# Patient Record
Sex: Female | Born: 1955
Health system: Southern US, Community
[De-identification: ages and names within clinical notes are randomized; demographics above are authoritative.]

## PROBLEM LIST (undated history)

## (undated) DIAGNOSIS — M8580 Other specified disorders of bone density and structure, unspecified site: Secondary | ICD-10-CM

## (undated) DIAGNOSIS — Z87442 Personal history of urinary calculi: Secondary | ICD-10-CM

## (undated) DIAGNOSIS — D649 Anemia, unspecified: Secondary | ICD-10-CM

## (undated) DIAGNOSIS — B86 Scabies: Secondary | ICD-10-CM

## (undated) DIAGNOSIS — N809 Endometriosis, unspecified: Secondary | ICD-10-CM

## (undated) DIAGNOSIS — D219 Benign neoplasm of connective and other soft tissue, unspecified: Secondary | ICD-10-CM

## (undated) HISTORY — DX: Other specified disorders of bone density and structure, unspecified site: M85.80

## (undated) HISTORY — PX: VARICOSE VEIN SURGERY: SHX832

## (undated) HISTORY — DX: Scabies: B86

## (undated) HISTORY — PX: EYE SURGERY: SHX253

## (undated) HISTORY — DX: Endometriosis, unspecified: N80.9

## (undated) HISTORY — DX: Benign neoplasm of connective and other soft tissue, unspecified: D21.9

---

## 1998-10-01 HISTORY — PX: OTHER SURGICAL HISTORY: SHX169

## 1998-10-07 ENCOUNTER — Ambulatory Visit (HOSPITAL_COMMUNITY): Admission: RE | Admit: 1998-10-07 | Discharge: 1998-10-07 | Payer: Self-pay | Admitting: Obstetrics and Gynecology

## 1999-05-01 ENCOUNTER — Other Ambulatory Visit: Admission: RE | Admit: 1999-05-01 | Discharge: 1999-05-01 | Payer: Self-pay | Admitting: Obstetrics and Gynecology

## 1999-07-03 HISTORY — PX: SUPRACERVICAL ABDOMINAL HYSTERECTOMY: SHX5393

## 1999-07-03 HISTORY — PX: ABDOMINAL HYSTERECTOMY: SHX81

## 2004-04-19 ENCOUNTER — Other Ambulatory Visit: Admission: RE | Admit: 2004-04-19 | Discharge: 2004-04-19 | Payer: Self-pay | Admitting: Obstetrics and Gynecology

## 2004-05-18 ENCOUNTER — Ambulatory Visit (HOSPITAL_COMMUNITY): Admission: RE | Admit: 2004-05-18 | Discharge: 2004-05-18 | Payer: Self-pay | Admitting: Obstetrics and Gynecology

## 2005-04-23 ENCOUNTER — Other Ambulatory Visit: Admission: RE | Admit: 2005-04-23 | Discharge: 2005-04-23 | Payer: Self-pay | Admitting: Obstetrics and Gynecology

## 2005-05-21 ENCOUNTER — Ambulatory Visit (HOSPITAL_COMMUNITY): Admission: RE | Admit: 2005-05-21 | Discharge: 2005-05-21 | Payer: Self-pay | Admitting: Obstetrics and Gynecology

## 2005-06-13 ENCOUNTER — Encounter: Admission: RE | Admit: 2005-06-13 | Discharge: 2005-06-13 | Payer: Self-pay | Admitting: Obstetrics and Gynecology

## 2006-04-26 ENCOUNTER — Other Ambulatory Visit: Admission: RE | Admit: 2006-04-26 | Discharge: 2006-04-26 | Payer: Self-pay | Admitting: Obstetrics & Gynecology

## 2006-05-27 ENCOUNTER — Ambulatory Visit (HOSPITAL_COMMUNITY): Admission: RE | Admit: 2006-05-27 | Discharge: 2006-05-27 | Payer: Self-pay | Admitting: Obstetrics & Gynecology

## 2007-05-07 ENCOUNTER — Other Ambulatory Visit: Admission: RE | Admit: 2007-05-07 | Discharge: 2007-05-07 | Payer: Self-pay | Admitting: *Deleted

## 2007-05-30 ENCOUNTER — Ambulatory Visit (HOSPITAL_COMMUNITY): Admission: RE | Admit: 2007-05-30 | Discharge: 2007-05-30 | Payer: Self-pay | Admitting: *Deleted

## 2008-05-11 ENCOUNTER — Other Ambulatory Visit: Admission: RE | Admit: 2008-05-11 | Discharge: 2008-05-11 | Payer: Self-pay | Admitting: Obstetrics and Gynecology

## 2008-06-03 ENCOUNTER — Ambulatory Visit (HOSPITAL_COMMUNITY): Admission: RE | Admit: 2008-06-03 | Discharge: 2008-06-03 | Payer: Self-pay | Admitting: Obstetrics & Gynecology

## 2009-06-06 ENCOUNTER — Ambulatory Visit (HOSPITAL_COMMUNITY): Admission: RE | Admit: 2009-06-06 | Discharge: 2009-06-06 | Payer: Self-pay | Admitting: Obstetrics & Gynecology

## 2010-06-07 ENCOUNTER — Ambulatory Visit (HOSPITAL_COMMUNITY)
Admission: RE | Admit: 2010-06-07 | Discharge: 2010-06-07 | Payer: Self-pay | Source: Home / Self Care | Attending: Obstetrics and Gynecology | Admitting: Obstetrics and Gynecology

## 2011-04-18 ENCOUNTER — Other Ambulatory Visit: Payer: Self-pay | Admitting: Obstetrics and Gynecology

## 2011-04-18 DIAGNOSIS — Z1231 Encounter for screening mammogram for malignant neoplasm of breast: Secondary | ICD-10-CM

## 2011-06-13 ENCOUNTER — Ambulatory Visit (HOSPITAL_COMMUNITY)
Admission: RE | Admit: 2011-06-13 | Discharge: 2011-06-13 | Disposition: A | Discharge status: Home / Self Care | Payer: 59 | Source: Ambulatory Visit | Attending: Obstetrics and Gynecology | Admitting: Obstetrics and Gynecology

## 2011-06-13 DIAGNOSIS — Z1231 Encounter for screening mammogram for malignant neoplasm of breast: Secondary | ICD-10-CM

## 2012-04-30 ENCOUNTER — Other Ambulatory Visit: Payer: Self-pay | Admitting: Certified Nurse Midwife

## 2012-04-30 DIAGNOSIS — Z1231 Encounter for screening mammogram for malignant neoplasm of breast: Secondary | ICD-10-CM

## 2012-06-16 ENCOUNTER — Ambulatory Visit (HOSPITAL_COMMUNITY)
Admission: RE | Admit: 2012-06-16 | Discharge: 2012-06-16 | Disposition: A | Discharge status: Home / Self Care | Payer: 59 | Source: Ambulatory Visit | Attending: Certified Nurse Midwife | Admitting: Certified Nurse Midwife

## 2012-06-16 DIAGNOSIS — Z1231 Encounter for screening mammogram for malignant neoplasm of breast: Secondary | ICD-10-CM

## 2013-06-03 ENCOUNTER — Encounter: Payer: Self-pay | Admitting: Certified Nurse Midwife

## 2013-06-04 ENCOUNTER — Encounter: Payer: Self-pay | Admitting: Certified Nurse Midwife

## 2013-06-04 ENCOUNTER — Ambulatory Visit (INDEPENDENT_AMBULATORY_CARE_PROVIDER_SITE_OTHER): Payer: 59 | Admitting: Certified Nurse Midwife

## 2013-06-04 VITALS — BP 104/62 | HR 64 | Resp 16 | Ht 67.5 in | Wt 132.0 lb

## 2013-06-04 DIAGNOSIS — Z01419 Encounter for gynecological examination (general) (routine) without abnormal findings: Secondary | ICD-10-CM

## 2013-06-04 DIAGNOSIS — Z0000 Encounter for general adult medical examination without abnormal findings: Secondary | ICD-10-CM

## 2013-06-04 LAB — POCT URINALYSIS DIPSTICK
Bilirubin, UA: NEGATIVE
Blood, UA: NEGATIVE
Glucose, UA: NEGATIVE
Ketones, UA: NEGATIVE
Leukocytes, UA: NEGATIVE
Nitrite, UA: NEGATIVE
Protein, UA: NEGATIVE
Urobilinogen, UA: NEGATIVE
pH, UA: 5

## 2013-06-04 NOTE — Patient Instructions (Signed)

## 2013-06-04 NOTE — Progress Notes (Signed)
57 y.o. G48P1001 Married Caucasian Fe here for annual exam. Menopausal no HRT. Denies vaginal bleeding or vaginal dryness. Stressful year with elderly parents care in [place], and making preparation. Patient does have family support and friends support. Established with PCP this year with labs and aex, with shingle vaccine given.  Patient's last menstrual period was 12/31/1999.          Sexually active: no  The current method of family planning is status post hysterectomy.    Exercising: yes  some exercise Smoker:  no  Health Maintenance: Pap:  05-26-12 neg HPV HR neg MMG:  06-17-12 neg Colonoscopy: 2008 BMD:   2009 TDaP:  2008 Labs: Poct urine-neg Self breast exam: not done   reports that she has never smoked. She does not have any smokeless tobacco history on file. She reports that she drinks about 1.5 ounces of alcohol per week. She reports that she does not use illicit drugs.  Past Medical History  Diagnosis Date  . Fibroid   . Endometriosis     Past Surgical History  Procedure Laterality Date  . Abdominal hysterectomy  2001    LAVH retained ovaries  . Hysteroscopic resection  4/00  . Varicose vein surgery  2011,2012,2013    Current Outpatient Prescriptions  Medication Sig Dispense Refill  . bimatoprost (LUMIGAN) 0.03 % ophthalmic solution 1 drop at bedtime.      . Calcium-Vitamin D-Vitamin K (VIACTIV PO) Take by mouth as needed.       No current facility-administered medications for this visit.    Family History  Problem Relation Age of Onset  . Heart disease Father   . Stroke Father   . Cancer Father     prostate  . Hypertension Mother   . Cancer Maternal Grandfather     liver    ROS:  Pertinent items are noted in HPI.  Otherwise, a comprehensive ROS was negative.  Exam:   BP 104/62  Pulse 64  Resp 16  Ht 5' 7.5" (1.715 m)  Wt 132 lb (59.875 kg)  BMI 20.36 kg/m2  LMP 12/31/1999 Height: 5' 7.5" (171.5 cm)  Ht Readings from Last 3 Encounters:   06/04/13 5' 7.5" (1.715 m)    General appearance: alert, cooperative and appears stated age Head: Normocephalic, without obvious abnormality, atraumatic Neck: no adenopathy, supple, symmetrical, trachea midline and thyroid normal to inspection and palpation and non-palpable Lungs: clear to auscultation bilaterally Breasts: normal appearance, no masses or tenderness, No nipple retraction or dimpling, No nipple discharge or bleeding, No axillary or supraclavicular adenopathy Heart: regular rate and rhythm Abdomen: soft, non-tender; no masses,  no organomegaly Extremities: extremities normal, atraumatic, no cyanosis or edema Skin: Skin color, texture, turgor normal. No rashes or lesions Lymph nodes: Cervical, supraclavicular, and axillary nodes normal. No abnormal inguinal nodes palpated Neurologic: Grossly normal   Pelvic: External genitalia:  no lesions              Urethra:  normal appearing urethra with no masses, tenderness or lesions              Bartholin's and Skene's: normal                 Vagina: normal appearing vagina with normal color and discharge, no lesions              Cervix: normal non tender              Pap taken: no Bimanual Exam:  Uterus:  uterus  absent              Adnexa: normal adnexa and no mass, fullness, tenderness               Rectovaginal: Confirms               Anus:  normal sphincter tone, no lesions  A:  Well Woman with normal exam  Menopausal no HRT LAVH suprcervical ovaries retained fibroids  Social stress with family care  P:   Reviewed health and wellness pertinent to exam  Aware of need to evaluate if vaginal bleeding  Pap smear as per guidelines   Mammogram yearly pap smear not taken today  counseled on breast self exam, mammography screening, menopause, osteoporosis, diet and exercise, Kegel's exercises  return annually or prn  An After Visit Summary was printed and given to the patient.

## 2013-06-06 NOTE — Progress Notes (Signed)
Note reviewed, agree with plan.  Salathiel Ferrara, MD  

## 2013-06-08 ENCOUNTER — Telehealth: Payer: Self-pay | Admitting: Certified Nurse Midwife

## 2013-06-08 DIAGNOSIS — Z13820 Encounter for screening for osteoporosis: Secondary | ICD-10-CM

## 2013-06-08 NOTE — Telephone Encounter (Signed)
[facility]

## 2013-06-09 ENCOUNTER — Other Ambulatory Visit: Payer: Self-pay | Admitting: Certified Nurse Midwife

## 2013-06-09 DIAGNOSIS — Z1231 Encounter for screening mammogram for malignant neoplasm of breast: Secondary | ICD-10-CM

## 2013-06-09 NOTE — Telephone Encounter (Signed)
Debbi, last Dexa 2009. Okay to order?

## 2013-06-09 NOTE — Telephone Encounter (Signed)
Notified patient.  She will call to schedule.

## 2013-06-09 NOTE — Telephone Encounter (Signed)
Yes ok for BMD

## 2013-07-07 ENCOUNTER — Ambulatory Visit (HOSPITAL_COMMUNITY)
Admission: RE | Admit: 2013-07-07 | Discharge: 2013-07-07 | Disposition: A | Discharge status: Home / Self Care | Payer: 59 | Source: Ambulatory Visit | Attending: Certified Nurse Midwife | Admitting: Certified Nurse Midwife

## 2013-07-07 DIAGNOSIS — Z1231 Encounter for screening mammogram for malignant neoplasm of breast: Secondary | ICD-10-CM

## 2013-07-07 DIAGNOSIS — Z780 Asymptomatic menopausal state: Secondary | ICD-10-CM | POA: Insufficient documentation

## 2013-07-07 DIAGNOSIS — M8580 Other specified disorders of bone density and structure, unspecified site: Secondary | ICD-10-CM

## 2013-07-07 DIAGNOSIS — Z13820 Encounter for screening for osteoporosis: Secondary | ICD-10-CM

## 2013-07-07 HISTORY — DX: Other specified disorders of bone density and structure, unspecified site: M85.80

## 2013-07-13 ENCOUNTER — Telehealth: Payer: Self-pay

## 2013-07-13 ENCOUNTER — Encounter: Payer: Self-pay | Admitting: Certified Nurse Midwife

## 2013-07-13 NOTE — Telephone Encounter (Signed)
Called to give pt bmd results. See actual scan & results in epic. LMTCB

## 2013-07-14 NOTE — Telephone Encounter (Signed)
Patient is returning call to joy °

## 2013-07-14 NOTE — Telephone Encounter (Signed)
Pt notified of results & agrees with recommendations

## 2014-05-03 ENCOUNTER — Encounter: Payer: Self-pay | Admitting: Certified Nurse Midwife

## 2014-06-07 ENCOUNTER — Encounter: Payer: Self-pay | Admitting: Certified Nurse Midwife

## 2014-06-07 ENCOUNTER — Other Ambulatory Visit: Payer: Self-pay | Admitting: Certified Nurse Midwife

## 2014-06-07 ENCOUNTER — Ambulatory Visit (INDEPENDENT_AMBULATORY_CARE_PROVIDER_SITE_OTHER): Payer: 59 | Admitting: Certified Nurse Midwife

## 2014-06-07 VITALS — BP 92/60 | HR 64 | Resp 16 | Ht 67.25 in | Wt 136.0 lb

## 2014-06-07 DIAGNOSIS — Z1231 Encounter for screening mammogram for malignant neoplasm of breast: Secondary | ICD-10-CM

## 2014-06-07 DIAGNOSIS — Z0000 Encounter for general adult medical examination without abnormal findings: Secondary | ICD-10-CM

## 2014-06-07 DIAGNOSIS — Z124 Encounter for screening for malignant neoplasm of cervix: Secondary | ICD-10-CM

## 2014-06-07 DIAGNOSIS — Z01419 Encounter for gynecological examination (general) (routine) without abnormal findings: Secondary | ICD-10-CM

## 2014-06-07 LAB — POCT URINALYSIS DIPSTICK
Bilirubin, UA: NEGATIVE
Blood, UA: NEGATIVE
Glucose, UA: NEGATIVE
Ketones, UA: NEGATIVE
Leukocytes, UA: NEGATIVE
Nitrite, UA: NEGATIVE
Protein, UA: NEGATIVE
Urobilinogen, UA: NEGATIVE
pH, UA: 5

## 2014-06-07 NOTE — Progress Notes (Signed)
58 y.o. G4P1001 Married Caucasian Fe here for annual exam. Menopausal no HRT. Denies vaginal bleeding or vaginal discharge. Sees PCP yearly with labs. Social stress with mother in law who has fallen with multiple injuries and dementia. Father in law fell last year and is doing well in assisted living. Patient is leaving for [place] to assess situation. Does have family support. No other health issues today.  Patient's last menstrual period was 12/31/1999.          Sexually active: No.  The current method of family planning is status post hysterectomy.    Exercising: Yes.    eliptical,hiking & yoga Smoker:  no  Health Maintenance: Pap: 05-26-12 neg HPV HR neg MMG: 07-07-13 density category c, birads category 1:neg Colonoscopy:  2008 BMD:  07-07-13 TDaP: 2008 Labs: Poct urine-neg Self breast exam: not done   reports that she has never smoked. She does not have any smokeless tobacco history on file. She reports that she drinks about 1.8 oz of alcohol per week. She reports that she does not use illicit drugs.  Past Medical History  Diagnosis Date  . Fibroid   . Endometriosis   . Osteopenia 07-07-13    spine only -1.2    Past Surgical History  Procedure Laterality Date  . Abdominal hysterectomy  2001    LAVH retained ovaries  . Hysteroscopic resection  4/00  . Varicose vein surgery  2011,2012,2013    Current Outpatient Prescriptions  Medication Sig Dispense Refill  . Ascorbic Acid (VITAMIN C PO) Take by mouth daily.    . bimatoprost (LUMIGAN) 0.03 % ophthalmic solution 1 drop at bedtime.    . Calcium-Vitamin D-Vitamin K (VIACTIV PO) Take by mouth as needed.    . Cholecalciferol (VITAMIN D PO) Take by mouth daily.     No current facility-administered medications for this visit.    Family History  Problem Relation Age of Onset  . Heart disease Father   . Stroke Father   . Cancer Father     prostate  . Hypertension Mother   . Cancer Maternal Grandfather     liver    ROS:   Pertinent items are noted in HPI.  Otherwise, a comprehensive ROS was negative.  Exam:   BP 92/60 mmHg  Pulse 64  Resp 16  Ht 5' 7.25" (1.708 m)  Wt 136 lb (61.689 kg)  BMI 21.15 kg/m2  LMP 12/31/1999 Height: 5' 7.25" (170.8 cm)  Ht Readings from Last 3 Encounters:  06/07/14 5' 7.25" (1.708 m)  06/04/13 5' 7.5" (1.715 m)    General appearance: alert, cooperative and appears stated age Head: Normocephalic, without obvious abnormality, atraumatic Neck: no adenopathy, supple, symmetrical, trachea midline and thyroid normal to inspection and palpation and non-palpable Lungs: clear to auscultation bilaterally Breasts: normal appearance, no masses or tenderness, Inspection negative, No nipple retraction or dimpling, No nipple discharge or bleeding Heart: regular rate and rhythm Abdomen: soft, non-tender; no masses,  no organomegaly Extremities: extremities normal, atraumatic, no cyanosis or edema Skin: Skin color, texture, turgor normal. No rashes or lesions Lymph nodes: Cervical, supraclavicular, and axillary nodes normal. No abnormal inguinal nodes palpated Neurologic: Grossly normal   Pelvic: External genitalia:  no lesions              Urethra:  normal appearing urethra with no masses, tenderness or lesions              Bartholin's and Skene's: normal  Vagina: normal appearing vagina with normal color and discharge, no lesions              Cervix: normal, non tender              Pap taken: Yes.   Bimanual Exam:  Uterus:  uterus absent              Adnexa: normal adnexa and no mass, fullness, tenderness               Rectovaginal: Confirms               Anus:  normal sphincter tone, no lesions  A:  Well Woman with normal exam  Menopausal no HRT S/P LAVH supracervical with ovaries retained for endometrosis  Social stress with family care giving, but has family support    P:   Reviewed health and wellness pertinent to exam  Aware of need to advise if vaginal  bleeding.  Stressed taking time for self and seek support as needed.  Pap smear taken today with HPV reflex   counseled on breast self exam, mammography screening, adequate intake of calcium and vitamin D, diet and exercise  return annually or prn  An After Visit Summary was printed and given to the patient.

## 2014-06-07 NOTE — Patient Instructions (Signed)

## 2014-06-08 LAB — IPS PAP TEST WITH REFLEX TO HPV

## 2014-06-09 NOTE — Progress Notes (Signed)
Reviewed personally.  M. Suzanne Ricardo Schubach, MD.  

## 2014-07-12 ENCOUNTER — Ambulatory Visit (HOSPITAL_COMMUNITY)
Admission: RE | Admit: 2014-07-12 | Discharge: 2014-07-12 | Disposition: A | Discharge status: Home / Self Care | Payer: 59 | Source: Ambulatory Visit | Attending: Certified Nurse Midwife | Admitting: Certified Nurse Midwife

## 2014-07-12 DIAGNOSIS — Z1231 Encounter for screening mammogram for malignant neoplasm of breast: Secondary | ICD-10-CM | POA: Insufficient documentation

## 2015-06-15 ENCOUNTER — Encounter: Payer: Self-pay | Admitting: Certified Nurse Midwife

## 2015-06-15 ENCOUNTER — Ambulatory Visit (INDEPENDENT_AMBULATORY_CARE_PROVIDER_SITE_OTHER): Payer: 59 | Admitting: Certified Nurse Midwife

## 2015-06-15 VITALS — BP 110/72 | HR 72 | Resp 18 | Wt 140.0 lb

## 2015-06-15 DIAGNOSIS — N8110 Cystocele, unspecified: Secondary | ICD-10-CM

## 2015-06-15 DIAGNOSIS — Z01419 Encounter for gynecological examination (general) (routine) without abnormal findings: Secondary | ICD-10-CM | POA: Diagnosis not present

## 2015-06-15 DIAGNOSIS — IMO0002 Reserved for concepts with insufficient information to code with codable children: Secondary | ICD-10-CM

## 2015-06-15 NOTE — Progress Notes (Signed)
59 y.o. G39P1001 Married  Caucasian Fe here for annual exam. Menopausal no HRT. Denies vaginal bleeding or vaginal dryness. Has reestablished with PCP to keep current. Had labs with PCP all normal per patient. Took shingles vaccine. No health issues today.  Patient's last menstrual period was 12/31/1999.          Sexually active: No.  The current method of family planning is none.    Exercising: Yes.    Elliptical, yoga, hiking Smoker:  no  Health Maintenance: Pap:  06/2014 Negative MMG:  07/13/2014 BIRADS 1 Colonoscopy:  2007 Normal as per patient BMD:  07/07/2014 nromal TDaP:  2008 Shingles: 2015 as per patient Pneumonia: Never Hep C and HIV: declined will do at PCP Labs: none   reports that she has never smoked. She has never used smokeless tobacco. She reports that she drinks about 1.8 oz of alcohol per week. She reports that she does not use illicit drugs.  Past Medical History  Diagnosis Date  . Fibroid   . Endometriosis   . Osteopenia 07-07-13    spine only -1.2    Past Surgical History  Procedure Laterality Date  . Abdominal hysterectomy  2001    LAVH retained ovaries  . Hysteroscopic resection  4/00  . Varicose vein surgery  2011,2012,2013    Current Outpatient Prescriptions  Medication Sig Dispense Refill  . Ascorbic Acid (VITAMIN C PO) Take by mouth daily.    . bimatoprost (LUMIGAN) 0.03 % ophthalmic solution 1 drop at bedtime.    . Calcium-Vitamin D-Vitamin K (VIACTIV PO) Take by mouth as needed.    . Cholecalciferol (VITAMIN D PO) Take by mouth daily.     No current facility-administered medications for this visit.    Family History  Problem Relation Age of Onset  . Heart disease Father   . Stroke Father   . Cancer Father     prostate  . Hypertension Mother   . Cancer Maternal Grandfather     liver    ROS:  Pertinent items are noted in HPI.  Otherwise, a comprehensive ROS was negative.  Exam:   LMP 12/31/1999   Ht Readings from Last 3 Encounters:   06/07/14 5' 7.25" (1.708 m)  06/04/13 5' 7.5" (1.715 m)    General appearance: alert, cooperative and appears stated age Head: Normocephalic, without obvious abnormality, atraumatic Neck: no adenopathy, supple, symmetrical, trachea midline and thyroid normal to inspection and palpation Lungs: clear to auscultation bilaterally Breasts: normal appearance, no masses or tenderness, No nipple retraction or dimpling, No nipple discharge or bleeding, No axillary or supraclavicular adenopathy Heart: regular rate and rhythm Abdomen: soft, non-tender; no masses,  no organomegaly Extremities: extremities normal, atraumatic, no cyanosis or edema Skin: Skin color, texture, turgor normal. No rashes or lesions Lymph nodes: Cervical, supraclavicular, and axillary nodes normal. No abnormal inguinal nodes palpated Neurologic: Grossly normal   Pelvic: External genitalia:  no lesions              Urethra:  normal appearing urethra with no masses, tenderness or lesions              Bartholin's and Skene's: normal                 Vagina: normal appearing vagina with normal color and discharge, no lesions grade 2 cystocele present, no change              Cervix: absent  Pap taken: No. Bimanual Exam:  Uterus:  uterus absent              Adnexa: normal adnexa and no mass, fullness, tenderness               Rectovaginal: Confirms               Anus:  normal sphincter tone, no lesions  Chaperone present: yes  A:  Well Woman with normal exam  Menopausal no HRT S/P TAH for bleeding ovaries retained  Cystocele grade 2 with no change,not symptomatic    P:   Reviewed health and wellness pertinent to exam  Patient will advise if bladder changes or symptoms or pressure or incontinence.  Pap smear as above not taken   counseled on breast self exam, mammography screening, adequate intake of calcium and vitamin D, diet and exercise, Kegel's exercises  return annually or prn  An After Visit  Summary was printed and given to the patient.

## 2015-06-15 NOTE — Patient Instructions (Signed)

## 2015-06-18 NOTE — Progress Notes (Signed)
Reviewed personally.  M. Suzanne Saher Davee, MD.  

## 2015-06-21 ENCOUNTER — Other Ambulatory Visit: Payer: Self-pay

## 2015-06-21 DIAGNOSIS — Z1231 Encounter for screening mammogram for malignant neoplasm of breast: Secondary | ICD-10-CM

## 2015-07-21 ENCOUNTER — Ambulatory Visit: Payer: 59

## 2015-08-01 ENCOUNTER — Ambulatory Visit
Admission: RE | Admit: 2015-08-01 | Discharge: 2015-08-01 | Disposition: A | Discharge status: Home / Self Care | Payer: 59 | Source: Ambulatory Visit

## 2015-08-01 DIAGNOSIS — Z1231 Encounter for screening mammogram for malignant neoplasm of breast: Secondary | ICD-10-CM

## 2016-06-15 ENCOUNTER — Ambulatory Visit: Payer: 59 | Admitting: Certified Nurse Midwife

## 2016-06-19 ENCOUNTER — Ambulatory Visit (INDEPENDENT_AMBULATORY_CARE_PROVIDER_SITE_OTHER): Payer: 59 | Admitting: Certified Nurse Midwife

## 2016-06-19 ENCOUNTER — Encounter: Payer: Self-pay | Admitting: Certified Nurse Midwife

## 2016-06-19 VITALS — BP 98/60 | HR 64 | Resp 16 | Ht 67.0 in | Wt 141.0 lb

## 2016-06-19 DIAGNOSIS — Z01419 Encounter for gynecological examination (general) (routine) without abnormal findings: Secondary | ICD-10-CM

## 2016-06-19 DIAGNOSIS — Z0000 Encounter for general adult medical examination without abnormal findings: Secondary | ICD-10-CM

## 2016-06-19 DIAGNOSIS — Z23 Encounter for immunization: Secondary | ICD-10-CM | POA: Diagnosis not present

## 2016-06-19 DIAGNOSIS — Z124 Encounter for screening for malignant neoplasm of cervix: Secondary | ICD-10-CM | POA: Diagnosis not present

## 2016-06-19 LAB — CBC
HCT: 41.9 % (ref 35.0–45.0)
HEMOGLOBIN: 13.8 g/dL (ref 11.7–15.5)
MCH: 31.8 pg (ref 27.0–33.0)
MCHC: 32.9 g/dL (ref 32.0–36.0)
MCV: 96.5 fL (ref 80.0–100.0)
MPV: 9.6 fL (ref 7.5–12.5)
PLATELETS: 273 10*3/uL (ref 140–400)
RBC: 4.34 MIL/uL (ref 3.80–5.10)
RDW: 13.3 % (ref 11.0–15.0)
WBC: 7.8 10*3/uL (ref 3.8–10.8)

## 2016-06-19 LAB — POCT URINALYSIS DIPSTICK
Bilirubin, UA: NEGATIVE
Blood, UA: NEGATIVE
Glucose, UA: NEGATIVE
Ketones, UA: NEGATIVE
Leukocytes, UA: NEGATIVE
Nitrite, UA: NEGATIVE
Protein, UA: NEGATIVE
Urobilinogen, UA: NEGATIVE
pH, UA: 5

## 2016-06-19 LAB — TSH: TSH: 1.29 m[IU]/L

## 2016-06-19 NOTE — Progress Notes (Signed)
60 y.o. G1P1001 + 1 adopted  Married  Caucasian Fe here for annual exam. Menopausal no HRT. Denies vaginal bleeding or vaginal dryness. Sees PCP prn. Desires screening labs and Hep C screening. Spouse has retired and now traveling a lot. No health issues today.  Patient's last menstrual period was 12/31/1999.          Sexually active: No.  The current method of family planning is status post hysterectomy.    Exercising: Yes.    [employer], yoga, treadmill, weights Smoker:  no  Health Maintenance: Pap:  12/15 neg MMG:  08-01-15 category c density birads 1:neg Colonoscopy:  2007 neg per patient BMD:   2015 TDaP:  2008 Shingles: 2013 Pneumonia: not done Hep C and HIV:HIV done Labs: poct urine-neg Self breast exam: not done   reports that she has never smoked. She has never used smokeless tobacco. She reports that she drinks about 1.8 oz of alcohol per week . She reports that she does not use drugs.  Past Medical History:  Diagnosis Date  . Endometriosis   . Fibroid   . Osteopenia 07-07-13   spine only -1.2    Past Surgical History:  Procedure Laterality Date  . ABDOMINAL HYSTERECTOMY  2001   LAVH retained ovaries  . hysteroscopic resection  4/00  . VARICOSE VEIN SURGERY  2011,2012,2013    Current Outpatient Prescriptions  Medication Sig Dispense Refill  . Ascorbic Acid (VITAMIN C PO) Take by mouth daily.    . Calcium-Vitamin D-Vitamin K (VIACTIV PO) Take by mouth as needed.    . Cholecalciferol (VITAMIN D PO) Take by mouth daily.    Marland Kitchen LASTACAFT 0.25 % SOLN      No current facility-administered medications for this visit.     Family History  Problem Relation Age of Onset  . Heart disease Father   . Stroke Father   . Cancer Father     prostate  . Hypertension Mother   . Heart attack Mother     pacemaker  . Cancer Maternal Grandfather     liver    ROS:  Pertinent items are noted in HPI.  Otherwise, a comprehensive ROS was negative.  Exam:   BP 98/60   Pulse 64    Resp 16   Ht 5\' 7"  (1.702 m)   Wt 141 lb (64 kg)   LMP 12/31/1999   BMI 22.08 kg/m  Height: 5\' 7"  (170.2 cm) Ht Readings from Last 3 Encounters:  06/19/16 5\' 7"  (1.702 m)  06/07/14 5' 7.25" (1.708 m)  06/04/13 5' 7.5" (1.715 m)    General appearance: alert, cooperative and appears stated age Head: Normocephalic, without obvious abnormality, atraumatic Neck: no adenopathy, supple, symmetrical, trachea midline and thyroid normal to inspection and palpation Lungs: clear to auscultation bilaterally Breasts: normal appearance, no masses or tenderness, No nipple retraction or dimpling, No nipple discharge or bleeding, No axillary or supraclavicular adenopathy Heart: regular rate and rhythm Abdomen: soft, non-tender; no masses,  no organomegaly Extremities: extremities normal, atraumatic, no cyanosis or edema Skin: Skin color, texture, turgor normal. No rashes or lesions Lymph nodes: Cervical, supraclavicular, and axillary nodes normal. No abnormal inguinal nodes palpated Neurologic: Grossly normal   Pelvic: External genitalia:  no lesions              Urethra:  normal appearing urethra with no masses, tenderness or lesions              Bartholin's and Skene's: normal  Vagina: normal appearing vagina with normal color and discharge, no lesions              Cervix: no bleeding following Pap, no cervical motion tenderness and no lesions              Pap taken: Yes.   Bimanual Exam:  Uterus:  normal size, contour, position, consistency, mobility, non-tender              Adnexa: normal adnexa and no mass, fullness, tenderness               Rectovaginal: Confirms               Anus:  normal sphincter tone, no lesions  Chaperone present: yes  A:  Well Woman with normal exam  Menopausal no HRT  Immunization update  Screening labs  P:   Reviewed health and wellness pertinent to exam  Aware of need to evaluate if vaginal bleeding  Requests TDAP  Labs: TSH, Vit. D, Lipid  Panel Hep C, CBC  Pap smear as above with HPV reflex   counseled on breast self exam, mammography screening, osteoporosis, adequate intake of calcium and vitamin D, diet and exercise  return annually or prn  An After Visit Summary was printed and given to the patient.

## 2016-06-19 NOTE — Patient Instructions (Signed)

## 2016-06-20 LAB — IPS PAP TEST WITH REFLEX TO HPV

## 2016-06-20 LAB — LIPID PANEL
Cholesterol: 193 mg/dL (ref <200)
HDL: 58 mg/dL (ref >50)
LDL Cholesterol: 117 mg/dL — ABNORMAL HIGH (ref <100)
Total CHOL/HDL Ratio: 3.3 Ratio (ref <5.0)
Triglycerides: 89 mg/dL (ref <150)
VLDL: 18 mg/dL (ref <30)

## 2016-06-20 LAB — VITAMIN D 25 HYDROXY (VIT D DEFICIENCY, FRACTURES): VIT D 25 HYDROXY: 57 ng/mL (ref 30–100)

## 2016-06-20 LAB — HEPATITIS C ANTIBODY: HCV AB: NEGATIVE

## 2016-06-22 NOTE — Progress Notes (Signed)
Encounter reviewed Jill Jertson, MD   

## 2016-06-28 ENCOUNTER — Telehealth: Payer: Self-pay | Admitting: Certified Nurse Midwife

## 2016-06-28 ENCOUNTER — Other Ambulatory Visit: Payer: Self-pay | Admitting: Certified Nurse Midwife

## 2016-06-28 DIAGNOSIS — Z1231 Encounter for screening mammogram for malignant neoplasm of breast: Secondary | ICD-10-CM

## 2016-06-28 DIAGNOSIS — M8588 Other specified disorders of bone density and structure, other site: Secondary | ICD-10-CM

## 2016-06-28 NOTE — Telephone Encounter (Signed)
Patient requesting order for mammogram and bone density be sent to [facility]

## 2016-06-28 NOTE — Telephone Encounter (Signed)
Order for BMD placed. Order for screening mammogram was previously entered. Left detailed message at number provided [phone], okay per ROI. Advised order has been placed and she may [facility] at her earliest convenience to schedule. Advised to return call to the office with any further questions or concerns.  Routing to provider for final review. Patient agreeable to disposition. Will close encounter.

## 2016-08-02 ENCOUNTER — Ambulatory Visit
Admission: RE | Admit: 2016-08-02 | Discharge: 2016-08-02 | Disposition: A | Discharge status: Home / Self Care | Payer: 59 | Source: Ambulatory Visit | Attending: Certified Nurse Midwife | Admitting: Certified Nurse Midwife

## 2016-08-02 ENCOUNTER — Ambulatory Visit: Payer: 59

## 2016-08-02 DIAGNOSIS — Z1231 Encounter for screening mammogram for malignant neoplasm of breast: Secondary | ICD-10-CM

## 2016-08-02 DIAGNOSIS — M8588 Other specified disorders of bone density and structure, other site: Secondary | ICD-10-CM

## 2017-01-10 DIAGNOSIS — H6983 Other specified disorders of Eustachian tube, bilateral: Secondary | ICD-10-CM | POA: Diagnosis not present

## 2017-01-10 DIAGNOSIS — H6993 Unspecified Eustachian tube disorder, bilateral: Secondary | ICD-10-CM | POA: Insufficient documentation

## 2017-01-10 DIAGNOSIS — J343 Hypertrophy of nasal turbinates: Secondary | ICD-10-CM | POA: Diagnosis not present

## 2017-01-10 DIAGNOSIS — Z7289 Other problems related to lifestyle: Secondary | ICD-10-CM | POA: Diagnosis not present

## 2017-01-28 DIAGNOSIS — Z23 Encounter for immunization: Secondary | ICD-10-CM | POA: Diagnosis not present

## 2017-04-02 DIAGNOSIS — H401131 Primary open-angle glaucoma, bilateral, mild stage: Secondary | ICD-10-CM | POA: Diagnosis not present

## 2017-04-02 DIAGNOSIS — H5213 Myopia, bilateral: Secondary | ICD-10-CM | POA: Diagnosis not present

## 2017-04-02 DIAGNOSIS — H5000 Unspecified esotropia: Secondary | ICD-10-CM | POA: Diagnosis not present

## 2017-05-13 DIAGNOSIS — H401131 Primary open-angle glaucoma, bilateral, mild stage: Secondary | ICD-10-CM | POA: Diagnosis not present

## 2017-05-13 NOTE — Progress Notes (Signed)
Ms. Daris has received her flu shot today at the [facility] to her LT deltoid Lot # [id number] NDC: [phone] Mfg: [id number] Expires: 12/29/17

## 2017-06-05 DIAGNOSIS — H401122 Primary open-angle glaucoma, left eye, moderate stage: Secondary | ICD-10-CM | POA: Diagnosis not present

## 2017-06-28 ENCOUNTER — Ambulatory Visit: Payer: 59 | Admitting: Certified Nurse Midwife

## 2017-07-08 ENCOUNTER — Other Ambulatory Visit: Payer: Self-pay | Admitting: Certified Nurse Midwife

## 2017-07-08 DIAGNOSIS — Z1231 Encounter for screening mammogram for malignant neoplasm of breast: Secondary | ICD-10-CM

## 2017-07-09 ENCOUNTER — Ambulatory Visit: Payer: 59 | Admitting: Certified Nurse Midwife

## 2017-07-15 DIAGNOSIS — R21 Rash and other nonspecific skin eruption: Secondary | ICD-10-CM | POA: Diagnosis not present

## 2017-08-05 ENCOUNTER — Ambulatory Visit: Payer: 59

## 2017-08-07 DIAGNOSIS — R21 Rash and other nonspecific skin eruption: Secondary | ICD-10-CM | POA: Diagnosis not present

## 2017-08-08 ENCOUNTER — Ambulatory Visit: Payer: 59

## 2017-08-15 ENCOUNTER — Ambulatory Visit (INDEPENDENT_AMBULATORY_CARE_PROVIDER_SITE_OTHER): Payer: BLUE CROSS/BLUE SHIELD | Admitting: Family Medicine

## 2017-08-15 ENCOUNTER — Encounter: Payer: Self-pay | Admitting: Family Medicine

## 2017-08-15 VITALS — BP 100/68 | HR 76 | Temp 98.1°F | Ht 66.0 in | Wt 137.0 lb

## 2017-08-15 DIAGNOSIS — B86 Scabies: Secondary | ICD-10-CM

## 2017-08-15 DIAGNOSIS — Z7689 Persons encountering health services in other specified circumstances: Secondary | ICD-10-CM | POA: Diagnosis not present

## 2017-08-15 NOTE — Patient Instructions (Addendum)
Scabies, Adult Scabies is a skin condition that happens when very small insects get under the skin (infestation). This causes a rash and severe itchiness. Scabies can spread from person to person (is contagious). If you get scabies, it is common for others in your household to get scabies too. With proper treatment, symptoms usually go away in 2-4 weeks. Scabies usually does not cause lasting problems. What are the causes? This condition is caused by mites (Sarcoptes scabiei, or human itch mites) that can only be seen with a microscope. The mites get into the top layer of skin and lay eggs. Scabies can spread from person to person through:  Close contact with a person who has scabies.  Contact with infested items, such as towels, bedding, or clothing.  What increases the risk? This condition is more likely to develop in:  People who live in nursing homes and other extended-care facilities.  People who have sexual contact with a partner who has scabies.  Young children who attend child care facilities.  People who care for others who are at increased risk for scabies.  What are the signs or symptoms? Symptoms of this condition may include:  Severe itchiness. This is often worse at night.  A rash that includes tiny red bumps or blisters. The rash commonly occurs on the wrist, elbow, armpit, fingers, waist, groin, or buttocks. Bumps may form a line (burrow) in some areas.  Skin irritation. This can include scaly patches or sores.  How is this diagnosed? This condition is diagnosed with a physical exam. Your health care provider will look closely at your skin. In some cases, your health care provider may take a sample of your affected skin (skin scraping) and have it examined under a microscope. How is this treated? This condition may be treated with:  Medicated cream or lotion that kills the mites. This is spread on the entire body and left on for several hours. Usually, one treatment  with medicated cream or lotion is enough to kill all of the mites. In severe cases, the treatment may be repeated.  Medicated cream that relieves itching.  Medicines that help to relieve itching.  Medicines that kill the mites. This treatment is rarely used.  Follow these instructions at home:  Medicines  Take or apply over-the-counter and prescription medicines as told by your health care provider.  Apply medicated cream or lotion as told by your health care provider.  Do not wash off the medicated cream or lotion until the necessary amount of time has passed. Skin Care  Avoid scratching your affected skin.  Keep your fingernails closely trimmed to reduce injury from scratching.  Take cool baths or apply cool washcloths to help reduce itching. General instructions  Clean all items that you recently had contact with, including bedding, clothing, and furniture. Do this on the same day that your treatment starts. ? Use hot water when you wash items. ? Place unwashable items into closed, airtight plastic bags for at least 3 days. The mites cannot live for more than 3 days away from human skin. ? Vacuum furniture and mattresses that you use.  Make sure that other people who may have been infested are examined by a health care provider. These include members of your household and anyone who may have had contact with infested items.  Keep all follow-up visits as told by your health care provider. This is important. Contact a health care provider if:  You have itching that does not go away   after 4 weeks of treatment.  You continue to develop new bumps or burrows.  You have redness, swelling, or pain in your rash area after treatment.  You have fluid, blood, or pus coming from your rash. This information is not intended to replace advice given to you by your health care provider. Make sure you discuss any questions you have with your health care provider. Document Released:  03/09/2015 Document Revised: 11/24/2015 Document Reviewed: 01/18/2015 Elsevier Interactive Patient Education  2018 [employer]  

## 2017-08-15 NOTE — Progress Notes (Signed)
Patient presents to clinic today to establish care.  SUBJECTIVE: PMH: Pt is a 63 yo female with no sig pmh.  Patient was formally seen at [facility] family practice.  Possible scabies: -Patient states she recently started having pruritic bumps on her abdomen and back. -She went to a clinic and was given prednisone cream -Patient states she continued to have itching and more bump superior. -She went back to the clinic and was diagnosed with scabies.  Given permethrin cream. -Patient use the cream one time and endorses continued itching. -Patient thinks she came into contact with scabies when she went to visit her father-in-law in a nursing home in [place] who had similar undiagnosed symptoms. -Patient has also washed her bedding at home.  Allergies: Patient is unsure of the medication she is allergic to but states it was given to her when she had an ear infection and caused a rash.  Patient does not think it was amoxicillin but thinks it might of been Augmentin.  Past surgical history: Hysterectomy secondary to endometriosis and excessive bleeding in 2001  Social history: Patient is married.  She has 1 child.  She is currently retired.  Patient denies tobacco or drug use.  Patient endorses social alcohol use.  Family medical history: Mom-alive, hearing loss Dad-alive, age 77, prostate cancer currently being monitored for progression, heart disease, stroke Brother-alive Brother-alive Daughter-alive MGM-deceased MGF-deceased, liver cancer PGM-deceased, stroke PGF-deceased, MI  Health Maintenance: Vision --[place] Immunizations -- Influenza vaccine up-to-date.  Last tetanus shot 2017 colonoscopy --2017.  Patient states she had shingrix and pneumococcal vaccine in 2018. Mammogram --2018.  Has a new one scheduled in the next few months PAP --2018  Patient is followed by Evalee Mutton at [facility] health  Past Medical History:  Diagnosis Date  . Endometriosis   .  Fibroid   . Osteopenia 07-07-13   spine only -1.2    Past Surgical History:  Procedure Laterality Date  . ABDOMINAL HYSTERECTOMY  2001   LAVH retained ovaries  . hysteroscopic resection  4/00  . VARICOSE VEIN SURGERY  2011,2012,2013    Current Outpatient Medications on File Prior to Visit  Medication Sig Dispense Refill  . Ascorbic Acid (VITAMIN C PO) Take by mouth daily.    . Calcium-Vitamin D-Vitamin K (VIACTIV PO) Take by mouth as needed.    . Cholecalciferol (VITAMIN D PO) Take by mouth daily.    Marland Kitchen LASTACAFT 0.25 % SOLN      No current facility-administered medications on file prior to visit.     Allergies  Allergen Reactions  . Anectine [Succinylcholine Chloride]     Family History  Problem Relation Age of Onset  . Heart disease Father   . Stroke Father   . Cancer Father        prostate  . Hypertension Mother   . Heart attack Mother        pacemaker  . Cancer Maternal Grandfather        liver    Social History   Socioeconomic History  . Marital status: Married    Spouse name: Not on file  . Number of children: Not on file  . Years of education: Not on file  . Highest education level: Not on file  Social Needs  . Financial resource strain: Not on file  . Food insecurity - worry: Not on file  . Food insecurity - inability: Not on file  . Transportation needs - medical: Not on file  . Transportation needs -  non-medical: Not on file  Occupational History  . Not on file  Tobacco Use  . Smoking status: Never Smoker  . Smokeless tobacco: Never Used  Substance and Sexual Activity  . Alcohol use: Yes    Alcohol/week: 1.8 oz    Types: 3 Standard drinks or equivalent per week  . Drug use: No  . Sexual activity: No    Partners: Male    Birth control/protection: Surgical    Comment: LAVH  Other Topics Concern  . Not on file  Social History Narrative  . Not on file    ROS General: Denies fever, chills, night sweats, changes in weight, changes in  appetite HEENT: Denies headaches, ear pain, changes in vision, rhinorrhea, sore throat CV: Denies CP, palpitations, SOB, orthopnea Pulm: Denies SOB, cough, wheezing GI: Denies abdominal pain, nausea, vomiting, diarrhea, constipation GU: Denies dysuria, hematuria, frequency, vaginal discharge Msk: Denies muscle cramps, joint pains Neuro: Denies weakness, numbness, tingling Skin: Denies bruising  + pruritic rash  Psych: Denies depression, anxiety, hallucinations  BP 100/68 (BP Location: Left Arm, Patient Position: Sitting, Cuff Size: Normal)   Pulse 76   Temp 98.1 F (36.7 C) (Oral)   Ht 5\' 6"  (1.676 m)   Wt 137 lb (62.1 kg)   LMP 12/31/1999   SpO2 97%   BMI 22.11 kg/m   Physical Exam Gen. Pleasant, well developed, well-nourished, in NAD HEENT - [place]/AT, PERRL, no nasal drainage, pharynx without erythema or exudate.  TMs normal bilaterally.  No cervical lymphadenopathy Lungs: no use of accessory muscles, CTAB, no wheezes, rales or rhonchi Cardiovascular: RRR, No r/g/m, no peripheral edema Abdomen: BS present, soft, nontender,nondistended Neuro:  A&Ox3, CN II-XII intact, normal gait Skin:  Warm, dry, intact.  Small erythematous, pruritic drying, lesions along torso and back and a few scattered on bilateral upper extremities. Psych: normal affect, mood appropriate  No results found for this or any previous visit (from the past 2160 hour(s)).  Assessment/Plan: Scabies -pt advised to repeat dose of Permethrin cream.   -pt has already washed linens  -given handout  -advised likely to feel more pruritic after dose of Permethrin as the mites are killed they release irritants.  Encounter to establish care -We reviewed the PMH, PSH, FH, SH, Meds and Allergies. -We provided refills for any medications we will prescribe as needed. -We addressed current concerns per orders and patient instructions. -We have asked for records for pertinent exams, studies, vaccines and notes from previous  providers. -We have advised patient to follow up per instructions below.  F/u prn  Grier Mitts, MD

## 2017-08-16 ENCOUNTER — Encounter: Payer: Self-pay | Admitting: Family Medicine

## 2017-08-28 ENCOUNTER — Ambulatory Visit: Payer: 59 | Admitting: Certified Nurse Midwife

## 2017-08-28 ENCOUNTER — Ambulatory Visit
Admission: RE | Admit: 2017-08-28 | Discharge: 2017-08-28 | Disposition: A | Discharge status: Home / Self Care | Payer: BLUE CROSS/BLUE SHIELD | Source: Ambulatory Visit | Attending: Certified Nurse Midwife | Admitting: Certified Nurse Midwife

## 2017-08-28 DIAGNOSIS — Z1231 Encounter for screening mammogram for malignant neoplasm of breast: Secondary | ICD-10-CM | POA: Diagnosis not present

## 2017-08-28 DIAGNOSIS — H401131 Primary open-angle glaucoma, bilateral, mild stage: Secondary | ICD-10-CM | POA: Diagnosis not present

## 2017-08-29 ENCOUNTER — Encounter: Payer: Self-pay | Admitting: Certified Nurse Midwife

## 2017-08-29 ENCOUNTER — Ambulatory Visit (INDEPENDENT_AMBULATORY_CARE_PROVIDER_SITE_OTHER): Payer: BLUE CROSS/BLUE SHIELD | Admitting: Certified Nurse Midwife

## 2017-08-29 VITALS — BP 90/60 | HR 60 | Resp 16 | Ht 67.25 in | Wt 136.0 lb

## 2017-08-29 DIAGNOSIS — N952 Postmenopausal atrophic vaginitis: Secondary | ICD-10-CM

## 2017-08-29 DIAGNOSIS — Z8619 Personal history of other infectious and parasitic diseases: Secondary | ICD-10-CM | POA: Diagnosis not present

## 2017-08-29 DIAGNOSIS — Z780 Asymptomatic menopausal state: Secondary | ICD-10-CM

## 2017-08-29 DIAGNOSIS — Z01419 Encounter for gynecological examination (general) (routine) without abnormal findings: Secondary | ICD-10-CM | POA: Diagnosis not present

## 2017-08-29 NOTE — Progress Notes (Signed)
62 y.o. G28P1001 Married  Caucasian Fe here for annual exam. Post menopausal no HRT. Denies vaginal bleeding or vaginal dryness. Was treated for scabies, has been checking on parents in different parts of the country and realized was due to father-in-law having and was treated. Still having some skin itching after using the treatment. Sees PCP yearly for labs and allergy management. No health issues today.  Patient's last menstrual period was 12/31/1999.          Sexually active: No.  The current method of family planning is status post hysterectomy.    Exercising: Yes.    treadmill, eliptical & yoga Smoker:  no  Health Maintenance: Pap:  12/15 neg, 06-19-16 neg History of Abnormal Pap: no MMG:  08-28-17 category c density birads 1:neg Self Breast exams: no Colonoscopy: 2017 f/u 89yrs BMD:   2018 TDaP:  2017 Shingles: 2018 Pneumonia: not done Hep C and HIV: Hep c neg 2017 Labs: per PCP   reports that  has never smoked. she has never used smokeless tobacco. She reports that she drinks about 1.8 oz of alcohol per week. She reports that she does not use drugs.  Past Medical History:  Diagnosis Date  . Endometriosis   . Fibroid   . Osteopenia 07-07-13   spine only -1.2    Past Surgical History:  Procedure Laterality Date  . ABDOMINAL HYSTERECTOMY  2001   LAVH retained ovaries  . EYE SURGERY     laser procedure in office  . hysteroscopic resection  4/00  . VARICOSE VEIN SURGERY  2011,2012,2013    Current Outpatient Medications  Medication Sig Dispense Refill  . Ascorbic Acid (VITAMIN C PO) Take by mouth daily.    Marland Kitchen BIOTIN PO Take by mouth.    . Calcium-Vitamin D-Vitamin K (VIACTIV PO) Take by mouth as needed.    . Cholecalciferol (VITAMIN D PO) Take by mouth daily.    . fluticasone (FLONASE) 50 MCG/ACT nasal spray Place into both nostrils daily.    Marland Kitchen latanoprost (XALATAN) 0.005 % ophthalmic solution PLACE 1 DROP IN BOTH EYES AT BEDTIME  12   No current facility-administered  medications for this visit.     Family History  Problem Relation Age of Onset  . Heart disease Father   . Stroke Father   . Cancer Father        prostate  . Hypertension Mother   . Heart attack Mother        pacemaker  . Cancer Maternal Grandfather        liver    ROS:  Pertinent items are noted in HPI.  Otherwise, a comprehensive ROS was negative.  Exam:   BP 90/60   Pulse 60   Resp 16   Ht 5' 7.25" (1.708 m)   Wt 136 lb (61.7 kg)   LMP 12/31/1999   BMI 21.14 kg/m  Height: 5' 7.25" (170.8 cm) Ht Readings from Last 3 Encounters:  08/29/17 5' 7.25" (1.708 m)  08/15/17 5\' 6"  (1.676 m)  06/19/16 5\' 7"  (1.702 m)    General appearance: alert, cooperative and appears stated age Head: Normocephalic, without obvious abnormality, atraumatic Neck: no adenopathy, supple, symmetrical, trachea midline and thyroid normal to inspection and palpation Lungs: clear to auscultation bilaterally Breasts: normal appearance, no masses or tenderness, No nipple retraction or dimpling, No nipple discharge or bleeding, No axillary or supraclavicular adenopathy Heart: regular rate and rhythm Abdomen: soft, non-tender; no masses,  no organomegaly Extremities: extremities normal, atraumatic, no cyanosis or  edema Skin: Skin color, texture, turgor normal. No rashes or lesions Lymph nodes: Cervical, supraclavicular, and axillary nodes normal. No abnormal inguinal nodes palpated Neurologic: Grossly normal   Pelvic: External genitalia:  no lesions              Urethra:  normal appearing urethra with no masses, tenderness or lesions              Bartholin's and Skene's: normal                 Vagina: normal appearing vagina with normal color and discharge, no lesions              Cervix: no cervical motion tenderness, no lesions and normal appearance              Pap taken: No. Bimanual Exam:  Uterus:  uterus absent              Adnexa: normal adnexa and no mass, fullness, tenderness                Rectovaginal: Confirms               Anus:  normal sphincter tone, no lesions  Chaperone present: yes  A:  Well Woman with normal exam  Post menopausal no HRT S/P TAH supracervical, ovaries retained  Atrophic vaginitis  Recent scabies occurrence with PCP follow up as needed    P:   Reviewed health and wellness pertinent to exam  Aware of need to advise if vaginal bleeding.  Discussed vaginal dryness options and will try OTC coconut oil or Olive oil and advise if no change  Discussed Aveeno tub bath to help with itching from Scabies treatment. Patient will try or will call PCP if continues.  Pap smear: no   counseled on breast self exam, mammography screening, feminine hygiene, adequate intake of calcium and vitamin D, diet and exercise, Kegel's exercises  return annually or prn  An After Visit Summary was printed and given to the patient.

## 2017-08-29 NOTE — Patient Instructions (Signed)

## 2017-09-02 ENCOUNTER — Encounter: Payer: Self-pay | Admitting: Family Medicine

## 2017-09-02 ENCOUNTER — Ambulatory Visit (INDEPENDENT_AMBULATORY_CARE_PROVIDER_SITE_OTHER): Payer: BLUE CROSS/BLUE SHIELD | Admitting: Family Medicine

## 2017-09-02 VITALS — BP 90/70 | HR 59 | Temp 98.2°F | Wt 139.6 lb

## 2017-09-02 DIAGNOSIS — B86 Scabies: Secondary | ICD-10-CM | POA: Diagnosis not present

## 2017-09-02 MED ORDER — HYDROXYZINE HCL 25 MG PO TABS
25.0000 mg | ORAL_TABLET | Freq: Three times a day (TID) | ORAL | 0 refills | Status: DC | PRN
Start: 2017-09-02 — End: 2018-09-10

## 2017-09-02 NOTE — Progress Notes (Signed)
Subjective:    Patient ID: Katherine Kerr, female    DOB: 10-20-1955, 62 y.o.   MRN: [id number]  Chief Complaint  Patient presents with  . Follow-up    rash from scabies (consults)     HPI Patient was seen today for ongoing concern.  Pt endorses continued itching secondary to scabies.  Pt completed a second dose of treatment with permethrin on 2/15.  Pt states she feels like she is being paranoid as they are new that have appeared on her breast, the other bumps have dried up.  Pt states she just wanted to make sure everything was taking care of because she plans to visit her elderly parents in the coming weeks.  Past Medical History:  Diagnosis Date  . Endometriosis   . Fibroid   . Osteopenia 07-07-13   spine only -1.2  . Scabies     Allergies  Allergen Reactions  . Anectine [Succinylcholine Chloride]   . Amoxicillin-Pot Clavulanate Rash    ROS General: Denies fever, chills, night sweats, changes in weight, changes in appetite HEENT: Denies headaches, ear pain, changes in vision, rhinorrhea, sore throat CV: Denies CP, palpitations, SOB, orthopnea Pulm: Denies SOB, cough, wheezing GI: Denies abdominal pain, nausea, vomiting, diarrhea, constipation GU: Denies dysuria, hematuria, frequency, vaginal discharge Msk: Denies muscle cramps, joint pains Neuro: Denies weakness, numbness, tingling Skin: Denies bruising  +pruritic rash Psych: Denies depression, anxiety, hallucinations     Objective:    Blood pressure 90/70, pulse (!) 59, temperature 98.2 F (36.8 C), temperature source Oral, weight 139 lb 9.6 oz (63.3 kg), last menstrual period 12/31/1999, SpO2 97 %.   Gen. Pleasant, well-nourished, in no distress, normal affect   HEENT: [place]/AT, face symmetric, no scleral icterus, nares patent without drainage Lungs: no accessory muscle use Cardiovascular: RRR, no peripheral edema Neuro:  A&Ox3, CN II-XII intact, normal gait Skin:  Warm, dry, intact.  Dried up, healing lesions on  back and abdomen.  Lesions previously present on bilateral forearms have resolved.  Erythematous lesions on right breast and at bra line present.   Wt Readings from Last 3 Encounters:  09/02/17 139 lb 9.6 oz (63.3 kg)  08/29/17 136 lb (61.7 kg)  08/15/17 137 lb (62.1 kg)    Lab Results  Component Value Date   WBC 7.8 06/19/2016   HGB 13.8 06/19/2016   HCT 41.9 06/19/2016   PLT 273 06/19/2016   CHOL 193 06/19/2016   TRIG 89 06/19/2016   HDL 58 06/19/2016   LDLCALC 117 (H) 06/19/2016   TSH 1.29 06/19/2016    Assessment/Plan:  Scabies -Patient reassured -Discussed likely course. -Given handout -If patient continues to have new bumps 4 weeks from date of last treatment with permethrin will consider ivermectin 200 mcg/kg as a single dose - Plan: hydrOXYzine (ATARAX/VISTARIL) 25 MG tablet -Follow-up PRN  Grier Mitts, MD

## 2017-09-02 NOTE — Patient Instructions (Addendum)
Scabies, Adult Scabies is a skin condition that happens when very small insects get under the skin (infestation). This causes a rash and severe itchiness. Scabies can spread from person to person (is contagious). If you get scabies, it is common for others in your household to get scabies too. With proper treatment, symptoms usually go away in 2-4 weeks. Scabies usually does not cause lasting problems. What are the causes? This condition is caused by mites (Sarcoptes scabiei, or human itch mites) that can only be seen with a microscope. The mites get into the top layer of skin and lay eggs. Scabies can spread from person to person through:  Close contact with a person who has scabies.  Contact with infested items, such as towels, bedding, or clothing.  What increases the risk? This condition is more likely to develop in:  People who live in nursing homes and other extended-care facilities.  People who have sexual contact with a partner who has scabies.  Young children who attend child care facilities.  People who care for others who are at increased risk for scabies.  What are the signs or symptoms? Symptoms of this condition may include:  Severe itchiness. This is often worse at night.  A rash that includes tiny red bumps or blisters. The rash commonly occurs on the wrist, elbow, armpit, fingers, waist, groin, or buttocks. Bumps may form a line (burrow) in some areas.  Skin irritation. This can include scaly patches or sores.  How is this diagnosed? This condition is diagnosed with a physical exam. Your health care provider will look closely at your skin. In some cases, your health care provider may take a sample of your affected skin (skin scraping) and have it examined under a microscope. How is this treated? This condition may be treated with:  Medicated cream or lotion that kills the mites. This is spread on the entire body and left on for several hours. Usually, one treatment  with medicated cream or lotion is enough to kill all of the mites. In severe cases, the treatment may be repeated.  Medicated cream that relieves itching.  Medicines that help to relieve itching.  Medicines that kill the mites. This treatment is rarely used.  Follow these instructions at home:  Medicines  Take or apply over-the-counter and prescription medicines as told by your health care provider.  Apply medicated cream or lotion as told by your health care provider.  Do not wash off the medicated cream or lotion until the necessary amount of time has passed. Skin Care  Avoid scratching your affected skin.  Keep your fingernails closely trimmed to reduce injury from scratching.  Take cool baths or apply cool washcloths to help reduce itching. General instructions  Clean all items that you recently had contact with, including bedding, clothing, and furniture. Do this on the same day that your treatment starts. ? Use hot water when you wash items. ? Place unwashable items into closed, airtight plastic bags for at least 3 days. The mites cannot live for more than 3 days away from human skin. ? Vacuum furniture and mattresses that you use.  Make sure that other people who may have been infested are examined by a health care provider. These include members of your household and anyone who may have had contact with infested items.  Keep all follow-up visits as told by your health care provider. This is important. Contact a health care provider if:  You have itching that does not go away   after 4 weeks of treatment.  You continue to develop new bumps or burrows.  You have redness, swelling, or pain in your rash area after treatment.  You have fluid, blood, or pus coming from your rash. This information is not intended to replace advice given to you by your health care provider. Make sure you discuss any questions you have with your health care provider. Document Released:  03/09/2015 Document Revised: 11/24/2015 Document Reviewed: 01/18/2015 Elsevier Interactive Patient Education  2018 [employer]  

## 2017-09-24 DIAGNOSIS — H401131 Primary open-angle glaucoma, bilateral, mild stage: Secondary | ICD-10-CM | POA: Diagnosis not present

## 2017-10-08 ENCOUNTER — Encounter: Payer: Self-pay | Admitting: Family Medicine

## 2017-10-08 ENCOUNTER — Ambulatory Visit (INDEPENDENT_AMBULATORY_CARE_PROVIDER_SITE_OTHER): Payer: BLUE CROSS/BLUE SHIELD | Admitting: Family Medicine

## 2017-10-08 VITALS — BP 122/62 | HR 96 | Temp 98.1°F | Wt 139.0 lb

## 2017-10-08 DIAGNOSIS — R21 Rash and other nonspecific skin eruption: Secondary | ICD-10-CM

## 2017-10-08 MED ORDER — IVERMECTIN 3 MG PO TABS
200.0000 ug/kg | ORAL_TABLET | Freq: Once | ORAL | 1 refills | Status: AC
Start: 2017-10-08 — End: 2017-10-08

## 2017-10-08 NOTE — Progress Notes (Signed)
Subjective:    Patient ID: Katherine Kerr, female    DOB: 12-06-1955, 62 y.o.   MRN: [id number]  Chief Complaint  Patient presents with  . Follow-up    HPI Patient was seen today for ongoing concern.  Pt seen on 07/15/17, 08/07/17, 08/15/17 and 09/02/17 for non specific rash and scabies.  The fist 2 visits were with UC.  Pt states she feels like there are a few remaining spots that have popped up and are pruritic.  It has been 7 wks since pt completed the last Elimite treatment.  Pt thought she came into contact with scabies while visiting her father in a nursing home [place] of state.  Past Medical History:  Diagnosis Date  . Endometriosis   . Fibroid   . Osteopenia 07-07-13   spine only -1.2  . Scabies     Allergies  Allergen Reactions  . Anectine [Succinylcholine Chloride]   . Amoxicillin-Pot Clavulanate Rash    ROS General: Denies fever, chills, night sweats, changes in weight, changes in appetite HEENT: Denies headaches, ear pain, changes in vision, rhinorrhea, sore throat CV: Denies CP, palpitations, SOB, orthopnea Pulm: Denies SOB, cough, wheezing GI: Denies abdominal pain, nausea, vomiting, diarrhea, constipation GU: Denies dysuria, hematuria, frequency, vaginal discharge Msk: Denies muscle cramps, joint pains Neuro: Denies weakness, numbness, tingling Skin: Denies bruising   +rash on torso and L leg. Psych: Denies depression, anxiety, hallucinations     Objective:    Blood pressure 122/62, pulse 96, temperature 98.1 F (36.7 C), temperature source Oral, weight 139 lb (63 kg), last menstrual period 12/31/1999, SpO2 98 %.   Gen. Pleasant, well-nourished, in no distress, normal affect   HEENT: [place]/AT, face symmetric, no scleral icterus, PERRLA, nares patent without drainage Lungs: no accessory muscle use, CTAB, no wheezes or rales Cardiovascular: RRR, no peripheral edema Neuro:  A&Ox3, CN II-XII intact, normal gait Skin:  Warm, an erythematous papule on the right mid  abdomen and R umbilicus.  Healing hyperpigmented/tan papules underneath R lateral breast near bra line.  A thin 54mm x 2.5 cm linear erythematous area on L medial calf.  Lesions on leg and abdomen scraped and sent to lab for eval.   Wt Readings from Last 3 Encounters:  10/08/17 139 lb (63 kg)  09/02/17 139 lb 9.6 oz (63.3 kg)  08/29/17 136 lb (61.7 kg)    Lab Results  Component Value Date   WBC 7.8 06/19/2016   HGB 13.8 06/19/2016   HCT 41.9 06/19/2016   PLT 273 06/19/2016   CHOL 193 06/19/2016   TRIG 89 06/19/2016   HDL 58 06/19/2016   LDLCALC 117 (H) 06/19/2016   TSH 1.29 06/19/2016    Assessment/Plan:  Rash and nonspecific skin eruption  -skin scraping obtained of lesions -pt advised to clean bedding and clothing again. -will send in rx for ivermectin.  Pt to repeat dose in 2 wks. - Plan: Scabies Examination, ivermectin (STROMECTOL) 3 MG TABS tablet -will call pt with lab results.  F/u prn  Grier Mitts, MD

## 2017-10-09 LAB — SCABIES EXAMINATION: SCABIES EXAMINATION: NONE SEEN

## 2018-04-09 DIAGNOSIS — H2513 Age-related nuclear cataract, bilateral: Secondary | ICD-10-CM | POA: Diagnosis not present

## 2018-04-09 DIAGNOSIS — H5213 Myopia, bilateral: Secondary | ICD-10-CM | POA: Diagnosis not present

## 2018-04-09 DIAGNOSIS — H35372 Puckering of macula, left eye: Secondary | ICD-10-CM | POA: Diagnosis not present

## 2018-04-09 DIAGNOSIS — H401131 Primary open-angle glaucoma, bilateral, mild stage: Secondary | ICD-10-CM | POA: Diagnosis not present

## 2018-04-17 DIAGNOSIS — Z23 Encounter for immunization: Secondary | ICD-10-CM | POA: Diagnosis not present

## 2018-07-17 DIAGNOSIS — I87393 Chronic venous hypertension (idiopathic) with other complications of bilateral lower extremity: Secondary | ICD-10-CM | POA: Diagnosis not present

## 2018-08-04 DIAGNOSIS — I87393 Chronic venous hypertension (idiopathic) with other complications of bilateral lower extremity: Secondary | ICD-10-CM | POA: Diagnosis not present

## 2018-09-04 ENCOUNTER — Other Ambulatory Visit: Payer: Self-pay | Admitting: Certified Nurse Midwife

## 2018-09-04 DIAGNOSIS — Z1231 Encounter for screening mammogram for malignant neoplasm of breast: Secondary | ICD-10-CM

## 2018-09-10 ENCOUNTER — Ambulatory Visit (INDEPENDENT_AMBULATORY_CARE_PROVIDER_SITE_OTHER): Payer: BLUE CROSS/BLUE SHIELD | Admitting: Certified Nurse Midwife

## 2018-09-10 ENCOUNTER — Other Ambulatory Visit: Payer: Self-pay

## 2018-09-10 ENCOUNTER — Other Ambulatory Visit (HOSPITAL_COMMUNITY)
Admission: RE | Admit: 2018-09-10 | Discharge: 2018-09-10 | Disposition: A | Discharge status: Home / Self Care | Payer: BLUE CROSS/BLUE SHIELD | Source: Ambulatory Visit | Attending: Certified Nurse Midwife | Admitting: Certified Nurse Midwife

## 2018-09-10 ENCOUNTER — Encounter: Payer: Self-pay | Admitting: Certified Nurse Midwife

## 2018-09-10 VITALS — BP 90/60 | HR 64 | Resp 16 | Ht 67.25 in | Wt 144.0 lb

## 2018-09-10 DIAGNOSIS — Z01419 Encounter for gynecological examination (general) (routine) without abnormal findings: Secondary | ICD-10-CM

## 2018-09-10 DIAGNOSIS — M898X9 Other specified disorders of bone, unspecified site: Secondary | ICD-10-CM | POA: Diagnosis not present

## 2018-09-10 DIAGNOSIS — Z124 Encounter for screening for malignant neoplasm of cervix: Secondary | ICD-10-CM | POA: Insufficient documentation

## 2018-09-10 DIAGNOSIS — Z780 Asymptomatic menopausal state: Secondary | ICD-10-CM

## 2018-09-10 NOTE — Progress Notes (Signed)
63 y.o. G32P1001 Married  Caucasian Fe here for annual exam.Menopausal no HRT. Denies  vaginal dryness. Has a trip planned in May in [place] and hopeful she can travel. Has noted some discomfort in right lower rib cage area this year and would like this checked. Started with walking outside more. Experimenting with different shoes for walking and has noted some change. Due for BMD this year.. Sees Dr. Volanda Napoleon for aex and labs. Eating well and exercising. No other health issues today.  Patient's last menstrual period was 12/31/1999.          Sexually active: No.  The current method of family planning is status post hysterectomy. (supracervical   Exercising: Yes.    hiking, treadmill, elliptical, yoga Smoker:  no  Review of Systems  Constitutional: Negative.   HENT: Negative.   Eyes: Negative.   Respiratory: Negative.   Cardiovascular: Negative.   Gastrointestinal: Negative.   Genitourinary: Negative.   Musculoskeletal: Positive for joint pain and myalgias.  Skin:       New or change in mole/lump  Neurological: Negative.   Endo/Heme/Allergies: Negative.   Psychiatric/Behavioral: Negative.     Health Maintenance: Pap:  06-19-16 neg (supracervical hysterectomy  History of Abnormal Pap: no MMG:  08-28-17 category c density birads 1:neg, scheduled for 10/2018 Self Breast exams: no Colonoscopy:  2017 f/u 52yrs  BMD:   2018  -1.1 right hip TDaP:  2017 Shingles: 2018 Pneumonia: not done Hep C and HIV: Hep c neg 2017 Labs: PCP   reports that she has never smoked. She has never used smokeless tobacco. She reports current alcohol use of about 3.0 standard drinks of alcohol per week. She reports that she does not use drugs.  Past Medical History:  Diagnosis Date  . Endometriosis   . Fibroid   . Osteopenia 07-07-13   spine only -1.2  . Scabies     Past Surgical History:  Procedure Laterality Date  . ABDOMINAL HYSTERECTOMY  2001   LAVH retained ovaries  . EYE SURGERY     laser procedure  in office  . hysteroscopic resection  4/00  . VARICOSE VEIN SURGERY  2011,2012,2013    Current Outpatient Medications  Medication Sig Dispense Refill  . Ascorbic Acid (VITAMIN C PO) Take by mouth daily.    Marland Kitchen BIOTIN PO Take by mouth.    . Calcium-Vitamin D-Vitamin K (VIACTIV PO) Take by mouth as needed.    . Cholecalciferol (VITAMIN D PO) Take by mouth daily.    . fluticasone (FLONASE) 50 MCG/ACT nasal spray Place into both nostrils daily.    . hydrOXYzine (ATARAX/VISTARIL) 25 MG tablet Take 1 tablet (25 mg total) by mouth 3 (three) times daily as needed for itching. 30 tablet 0  . latanoprost (XALATAN) 0.005 % ophthalmic solution PLACE 1 DROP IN BOTH EYES AT BEDTIME  12   No current facility-administered medications for this visit.     Family History  Problem Relation Age of Onset  . Heart disease Father   . Stroke Father   . Cancer Father        prostate  . Hypertension Mother   . Heart attack Mother        pacemaker  . Cancer Maternal Grandfather        liver    ROS:  Pertinent items are noted in HPI.  Otherwise, a comprehensive ROS was negative.  Exam:   LMP 12/31/1999    Ht Readings from Last 3 Encounters:  08/29/17 5' 7.25" (1.708  m)  08/15/17 5\' 6"  (1.676 m)  06/19/16 5\' 7"  (1.702 m)    General appearance: alert, cooperative and appears stated age Head: Normocephalic, without obvious abnormality, atraumatic Neck: no adenopathy, supple, symmetrical, trachea midline and thyroid normal to inspection and palpation Lungs: clear to auscultation bilaterally Breasts: normal appearance, no masses or tenderness, No nipple retraction or dimpling, No nipple discharge or bleeding, No axillary or supraclavicular adenopathy Heart: regular rate and rhythm Abdomen: soft, non-tender; no masses,  no organomegaly Extremities: extremities normal, atraumatic, no cyanosis or edema Skin: Skin color, texture, turgor normal. No rashes or lesions Lymph nodes: Cervical, supraclavicular,  and axillary nodes normal. No abnormal inguinal nodes palpated Neurologic: Grossly normal   Pelvic: External genitalia:  no lesions              Urethra:  normal appearing urethra with no masses, tenderness or lesions              Bartholin's and Skene's: normal                 Vagina: normal appearing vagina with normal color and discharge, no lesions              Cervix: no cervical motion tenderness, no lesions and normal appearance              Pap taken: Yes.   Bimanual Exam:  Uterus:  uterus absent              Adnexa: normal adnexa and no mass, fullness, tenderness               Rectovaginal: Confirms               Anus:  normal sphincter tone, no lesions  Chaperone present: yes  A:  Well Woman with normal exam  Menopausal S/P TAH ovaries retained, supracervical  ? Skeletal vs muscle change on right lower rib cage area.  Bone density due    P:   Reviewed health and wellness pertinent to exam  Aware of need to advise if vaginal bleeding  Discussed evaluation with PCP or orthopedic.Patient will consider and call. Change walking position with shoes to see if this varies also.  Order placed for Bone density patient to call to schedule  Pap smear: yes   counseled on breast self exam, mammography screening, feminine hygiene, adequate intake of calcium and vitamin D, diet and exercise, Kegel's exercises  return annually or prn  An After Visit Summary was printed and given to the patient.

## 2018-09-10 NOTE — Patient Instructions (Signed)

## 2018-09-12 LAB — CYTOLOGY - PAP
DIAGNOSIS: NEGATIVE
HPV: NOT DETECTED

## 2018-10-01 ENCOUNTER — Other Ambulatory Visit: Payer: BLUE CROSS/BLUE SHIELD

## 2018-10-01 ENCOUNTER — Ambulatory Visit: Payer: BLUE CROSS/BLUE SHIELD

## 2018-11-20 ENCOUNTER — Ambulatory Visit: Payer: BLUE CROSS/BLUE SHIELD

## 2018-11-20 ENCOUNTER — Other Ambulatory Visit: Payer: BLUE CROSS/BLUE SHIELD

## 2018-11-21 DIAGNOSIS — H401131 Primary open-angle glaucoma, bilateral, mild stage: Secondary | ICD-10-CM | POA: Diagnosis not present

## 2018-11-21 DIAGNOSIS — H2513 Age-related nuclear cataract, bilateral: Secondary | ICD-10-CM | POA: Diagnosis not present

## 2018-12-03 ENCOUNTER — Other Ambulatory Visit: Payer: Self-pay | Admitting: Certified Nurse Midwife

## 2018-12-03 DIAGNOSIS — Z780 Asymptomatic menopausal state: Secondary | ICD-10-CM

## 2019-01-16 DIAGNOSIS — H2513 Age-related nuclear cataract, bilateral: Secondary | ICD-10-CM | POA: Diagnosis not present

## 2019-01-29 DIAGNOSIS — H2512 Age-related nuclear cataract, left eye: Secondary | ICD-10-CM | POA: Diagnosis not present

## 2019-01-29 DIAGNOSIS — H25812 Combined forms of age-related cataract, left eye: Secondary | ICD-10-CM | POA: Diagnosis not present

## 2019-02-05 DIAGNOSIS — H2511 Age-related nuclear cataract, right eye: Secondary | ICD-10-CM | POA: Diagnosis not present

## 2019-02-05 DIAGNOSIS — H25811 Combined forms of age-related cataract, right eye: Secondary | ICD-10-CM | POA: Diagnosis not present

## 2019-02-17 ENCOUNTER — Other Ambulatory Visit: Payer: Self-pay

## 2019-02-17 ENCOUNTER — Ambulatory Visit
Admission: RE | Admit: 2019-02-17 | Discharge: 2019-02-17 | Disposition: A | Discharge status: Home / Self Care | Payer: Self-pay | Source: Ambulatory Visit | Attending: Certified Nurse Midwife | Admitting: Certified Nurse Midwife

## 2019-02-17 DIAGNOSIS — Z780 Asymptomatic menopausal state: Secondary | ICD-10-CM

## 2019-02-17 DIAGNOSIS — M8588 Other specified disorders of bone density and structure, other site: Secondary | ICD-10-CM | POA: Diagnosis not present

## 2019-02-17 DIAGNOSIS — Z1231 Encounter for screening mammogram for malignant neoplasm of breast: Secondary | ICD-10-CM | POA: Diagnosis not present

## 2019-02-18 ENCOUNTER — Other Ambulatory Visit: Payer: Self-pay | Admitting: Certified Nurse Midwife

## 2019-02-22 DIAGNOSIS — Z23 Encounter for immunization: Secondary | ICD-10-CM | POA: Diagnosis not present

## 2019-02-22 DIAGNOSIS — S61209A Unspecified open wound of unspecified finger without damage to nail, initial encounter: Secondary | ICD-10-CM | POA: Diagnosis not present

## 2019-02-22 DIAGNOSIS — S61210A Laceration without foreign body of right index finger without damage to nail, initial encounter: Secondary | ICD-10-CM | POA: Diagnosis not present

## 2019-09-09 DIAGNOSIS — Z961 Presence of intraocular lens: Secondary | ICD-10-CM | POA: Diagnosis not present

## 2019-09-09 DIAGNOSIS — H401131 Primary open-angle glaucoma, bilateral, mild stage: Secondary | ICD-10-CM | POA: Diagnosis not present

## 2019-09-11 ENCOUNTER — Encounter: Payer: Self-pay | Admitting: Family Medicine

## 2019-09-11 ENCOUNTER — Other Ambulatory Visit: Payer: Self-pay

## 2019-09-11 ENCOUNTER — Ambulatory Visit (INDEPENDENT_AMBULATORY_CARE_PROVIDER_SITE_OTHER): Payer: BC Managed Care – PPO | Admitting: Family Medicine

## 2019-09-11 VITALS — BP 96/80 | HR 83 | Temp 97.8°F | Wt 145.0 lb

## 2019-09-11 DIAGNOSIS — R300 Dysuria: Secondary | ICD-10-CM

## 2019-09-11 DIAGNOSIS — R319 Hematuria, unspecified: Secondary | ICD-10-CM

## 2019-09-11 LAB — CBC
HCT: 40.2 % (ref 36.0–46.0)
Hemoglobin: 13.8 g/dL (ref 12.0–15.0)
MCHC: 34.4 g/dL (ref 30.0–36.0)
MCV: 95.9 fl (ref 78.0–100.0)
Platelets: 289 10*3/uL (ref 150.0–400.0)
RBC: 4.19 Mil/uL (ref 3.87–5.11)
RDW: 13.2 % (ref 11.5–15.5)
WBC: 10.4 10*3/uL (ref 4.0–10.5)

## 2019-09-11 LAB — POC URINALSYSI DIPSTICK (AUTOMATED)
Bilirubin, UA: NEGATIVE
Glucose, UA: NEGATIVE
Ketones, UA: NEGATIVE
Leukocytes, UA: NEGATIVE
Nitrite, UA: NEGATIVE
Protein, UA: NEGATIVE
Spec Grav, UA: <=1.005 — AB (ref 1.010–1.025)
Urobilinogen, UA: 0.2 E.U./dL
pH, UA: 7 (ref 5.0–8.0)

## 2019-09-11 LAB — BASIC METABOLIC PANEL
BUN: 13 mg/dL (ref 6–23)
CO2: 30 mEq/L (ref 19–32)
Calcium: 10 mg/dL (ref 8.4–10.5)
Chloride: 102 mEq/L (ref 96–112)
Creatinine, Ser: 0.73 mg/dL (ref 0.40–1.20)
GFR: 80.31 mL/min (ref >60.00)
Glucose, Bld: 89 mg/dL (ref 70–99)
Potassium: 4.2 mEq/L (ref 3.5–5.1)
Sodium: 137 mEq/L (ref 135–145)

## 2019-09-11 LAB — URINALYSIS, MICROSCOPIC ONLY

## 2019-09-11 MED ORDER — NITROFURANTOIN MONOHYD MACRO 100 MG PO CAPS: 100.0000 mg | ORAL_CAPSULE | Freq: Two times a day (BID) | ORAL | 0 refills | Status: AC

## 2019-09-11 NOTE — Progress Notes (Signed)
Subjective:    Patient ID: Katherine Kerr, female    DOB: [birth year], 64 y.o.   MRN: [id number]  No chief complaint on file.   HPI Patient was seen today for acute concern.  Pt endorses urinary pressure, dysuria, and hematuria since last night.  Pt denies fever, chills, n/v, or back pain.  Drinking mostly water.  Past Medical History:  Diagnosis Date  . Endometriosis   . Fibroid   . Osteopenia 07-07-13   spine only -1.2  . Scabies     Allergies  Allergen Reactions  . Anectine [Succinylcholine Chloride]   . Amoxicillin-Pot Clavulanate Rash    ROS General: Denies fever, chills, night sweats, changes in weight, changes in appetite HEENT: Denies headaches, ear pain, changes in vision, rhinorrhea, sore throat CV: Denies CP, palpitations, SOB, orthopnea Pulm: Denies SOB, cough, wheezing GI: Denies abdominal pain, nausea, vomiting, diarrhea, constipation GU: Denies vaginal discharge +dysuria, frequency, hematuria Msk: Denies muscle cramps, joint pains Neuro: Denies weakness, numbness, tingling Skin: Denies rashes, bruising Psych: Denies depression, anxiety, hallucinations     Objective:    Blood pressure 96/80, pulse 83, temperature 97.8 F (36.6 C), temperature source Temporal, weight 145 lb (65.8 kg), last menstrual period 12/31/1999, SpO2 98 %.  Gen. Pleasant, well-nourished, in no distress, normal affect  HEENT: [place]/AT, face symmetric, no scleral icterus, PERRLA, nares patent without drainage Lungs: no accessory muscle use, CTAB, no wheezes or rales Cardiovascular: RRR, no m/r/g, no peripheral edema Abdomen: BS present, soft, NT/ND, no CVA tenderness.   Wt Readings from Last 3 Encounters:  09/11/19 145 lb (65.8 kg)  09/10/18 144 lb (65.3 kg)  10/08/17 139 lb (63 kg)    Lab Results  Component Value Date   WBC 7.8 06/19/2016   HGB 13.8 06/19/2016   HCT 41.9 06/19/2016   PLT 273 06/19/2016   CHOL 193 06/19/2016   TRIG 89 06/19/2016   HDL 58 06/19/2016   LDLCALC 117 (H) 06/19/2016   TSH 1.29 06/19/2016    Assessment/Plan:  Dysuria  -UA without obvious signs of infection.  2+ RBCs, SG <1.005 - Plan: Urine Culture, nitrofurantoin, macrocrystal-monohydrate, (MACROBID) 100 MG capsule, Basic metabolic panel, CBC (no diff)  Hematuria, unspecified type  -discussed possible causes including UTI, renal calculi, renal dz. -given handouts -continue fluids -consider imaging -given precautions for worsened symptoms. - Plan: Urine Microscopic Only, Urine Culture, POCT Urinalysis Dipstick (Automated), BMP, CBC  F/u f/u prn  Grier Mitts, MD

## 2019-09-11 NOTE — Patient Instructions (Signed)
Hematuria, Adult Hematuria is blood in the urine. Blood may be visible in the urine, or it may be identified with a test. This condition can be caused by infections of the bladder, urethra, kidney, or prostate. Other possible causes include:  Kidney stones.  Cancer of the urinary tract.  Too much calcium in the urine.  Conditions that are passed from parent to child (inherited conditions).  Exercise that requires a lot of energy. Infections can usually be treated with medicine, and a kidney stone usually will pass through your urine. If neither of these is the cause of your hematuria, more tests may be needed to identify the cause of your symptoms. It is very important to tell your health care provider about any blood in your urine, even if it is painless or the blood stops without treatment. Blood in the urine, when it happens and then stops and then happens again, can be a symptom of a very serious condition, including cancer. There is no pain in the initial stages of many urinary cancers. Follow these instructions at home: Medicines  Take over-the-counter and prescription medicines only as told by your health care provider.  If you were prescribed an antibiotic medicine, take it as told by your health care provider. Do not stop taking the antibiotic even if you start to feel better. Eating and drinking  Drink enough fluid to keep your urine clear or pale yellow. It is recommended that you drink 3-4 quarts (2.8-3.8 L) a day. If you have been diagnosed with an infection, it is recommended that you drink cranberry juice in addition to large amounts of water.  Avoid caffeine, tea, and carbonated beverages. These tend to irritate the bladder.  Avoid alcohol because it may irritate the prostate (men). General instructions  If you have been diagnosed with a kidney stone, follow your health care provider's instructions about straining your urine to catch the stone.  Empty your bladder  often. Avoid holding urine for long periods of time.  If you are female: ? After a bowel movement, wipe from front to back and use each piece of toilet paper only once. ? Empty your bladder before and after sex.  Pay attention to any changes in your symptoms. Tell your health care provider about any changes or any new symptoms.  It is your responsibility to get your test results. Ask your health care provider, or the department performing the test, when your results will be ready.  Keep all follow-up visits as told by your health care provider. This is important. Contact a health care provider if:  You develop back pain.  You have a fever.  You have nausea or vomiting.  Your symptoms do not improve after 3 days.  Your symptoms get worse. Get help right away if:  You develop severe vomiting and are unable take medicine without vomiting.  You develop severe pain in your back or abdomen even though you are taking medicine.  You pass a large amount of blood in your urine.  You pass blood clots in your urine.  You feel very weak or like you might faint.  You faint. Summary  Hematuria is blood in the urine. It has many possible causes.  It is very important that you tell your health care provider about any blood in your urine, even if it is painless or the blood stops without treatment.  Take over-the-counter and prescription medicines only as told by your health care provider.  Drink enough fluid to keep   your urine clear or pale yellow. This information is not intended to replace advice given to you by your health care provider. Make sure you discuss any questions you have with your health care provider. Document Revised: 11/12/2018 Document Reviewed: 07/21/2016 Elsevier Patient Education  [place].  Dysuria Dysuria is pain or discomfort while urinating. The pain or discomfort may be felt in the part of your body that drains urine from the bladder (urethra) or in  the surrounding tissue of the genitals. The pain may also be felt in the groin area, lower abdomen, or lower back. You may have to urinate frequently or have the sudden feeling that you have to urinate (urgency). Dysuria can affect both men and women, but it is more common in women. Dysuria can be caused by many different things, including:  Urinary tract infection.  Kidney stones or bladder stones.  Certain sexually transmitted infections (STIs), such as chlamydia.  Dehydration.  Inflammation of the tissues of the vagina.  Use of certain medicines.  Use of certain soaps or scented products that cause irritation. Follow these instructions at home: General instructions  Watch your condition for any changes.  Urinate often. Avoid holding urine for long periods of time.  After a bowel movement or urination, women should cleanse from front to back, using each tissue only once.  Urinate after sexual intercourse.  Keep all follow-up visits as told by your health care provider. This is important.  If you had any tests done to find the cause of dysuria, it is up to you to get your test results. Ask your health care provider, or the department that is doing the test, when your results will be ready. Eating and drinking   Drink enough fluid to keep your urine pale yellow.  Avoid caffeine, tea, and alcohol. They can irritate the bladder and make dysuria worse. In men, alcohol may irritate the prostate. Medicines  Take over-the-counter and prescription medicines only as told by your health care provider.  If you were prescribed an antibiotic medicine, take it as told by your health care provider. Do not stop taking the antibiotic even if you start to feel better. Contact a health care provider if:  You have a fever.  You develop pain in your back or sides.  You have nausea or vomiting.  You have blood in your urine.  You are not urinating as often as you usually do. Get help  right away if:  Your pain is severe and not relieved with medicines.  You cannot eat or drink without vomiting.  You are confused.  You have a rapid heartbeat while at rest.  You have shaking or chills.  You feel extremely weak. Summary  Dysuria is pain or discomfort while urinating. Many different conditions can lead to dysuria.  If you have dysuria, you may have to urinate frequently or have the sudden feeling that you have to urinate (urgency).  Watch your condition for any changes. Keep all follow-up visits as told by your health care provider.  Make sure that you urinate often and drink enough fluid to keep your urine pale yellow. This information is not intended to replace advice given to you by your health care provider. Make sure you discuss any questions you have with your health care provider. Document Revised: 05/31/2017 Document Reviewed: 04/04/2017 Elsevier Patient Education  2020 [place].  Kidney Stones Kidney stones are rock-like masses that form inside of the kidneys. Kidneys are organs that make pee (  urine). A kidney stone may move into other parts of the urinary tract, including:  The tubes that connect the kidneys to the bladder (ureters).  The bladder.  The tube that carries urine out of the body (urethra). Kidney stones can cause very bad pain and can block the flow of pee. The stone usually leaves your body (passes) through your pee. You may need to have a doctor take out the stone. What are the causes? Kidney stones may be caused by:  A condition in which certain glands make too much parathyroid hormone (primary hyperparathyroidism).  A buildup of a type of crystals in the bladder made of a chemical called uric acid. The body makes uric acid when you eat certain foods.  Narrowing (stricture) of one or both of the ureters.  A kidney blockage that you were born with.  Past surgery on the kidney or the ureters, such as gastric bypass  surgery. What increases the risk? You are more likely to develop this condition if:  You have had a kidney stone in the past.  You have a family history of kidney stones.  You do not drink enough water.  You eat a diet that is high in protein, salt (sodium), or sugar.  You are overweight or very overweight (obese). What are the signs or symptoms? Symptoms of a kidney stone may include:  Pain in the side of the belly, right below the ribs (flank pain). Pain usually spreads (radiates) to the groin.  Needing to pee often or right away (urgently).  Pain when going pee (urinating).  Blood in your pee (hematuria).  Feeling like you may vomit (nauseous).  Vomiting.  Fever and chills. How is this treated? Treatment depends on the size, location, and makeup of the kidney stones. The stones will often pass out of the body through peeing. You may need to:  Drink more fluid to help pass the stone. In some cases, you may be given fluids through an IV tube put into one of your veins at the hospital.  Take medicine for pain.  Make changes in your diet to help keep kidney stones from coming back. Sometimes, medical procedures are needed to remove a kidney stone. This may involve:  A procedure to break up kidney stones using a beam of light (laser) or shock waves.  Surgery to remove the kidney stones. Follow these instructions at home: Medicines  Take over-the-counter and prescription medicines only as told by your doctor.  Ask your doctor if the medicine prescribed to you requires you to avoid driving or using heavy machinery. Eating and drinking  Drink enough fluid to keep your pee pale yellow. You may be told to drink at least 8-10 glasses of water each day. This will help you pass the stone.  If told by your doctor, change your diet. This may include: ? Limiting how much salt you eat. ? Eating more fruits and vegetables. ? Limiting how much meat, poultry, fish, and eggs you  eat.  Follow instructions from your doctor about eating or drinking restrictions. General instructions  Collect pee samples as told by your doctor. You may need to collect a pee sample: ? 24 hours after a stone comes out. ? 8-12 weeks after a stone comes out, and every 6-12 months after that.  Strain your pee every time you pee (urinate), for as long as told. Use the strainer that your doctor recommends.  Do not throw out the stone. Keep it so that it can  be tested by your doctor.  Keep all follow-up visits as told by your doctor. This is important. You may need follow-up tests. How is this prevented? To prevent another kidney stone:  Drink enough fluid to keep your pee pale yellow. This is the best way to prevent kidney stones.  Eat healthy foods.  Avoid certain foods as told by your doctor. You may be told to eat less protein.  Stay at a healthy weight. Where to find more information  [place] ([facility]): www.kidney.[facility]): www.urologyhealth.org Contact a doctor if:  You have pain that gets worse or does not get better with medicine. Get help right away if:  You have a fever or chills.  You get very bad pain.  You get new pain in your belly (abdomen).  You pass out (faint).  You cannot pee. Summary  Kidney stones are rock-like masses that form inside of the kidneys.  Kidney stones can cause very bad pain and can block the flow of pee.  The stones will often pass out of the body through peeing.  Drink enough fluid to keep your pee pale yellow. This information is not intended to replace advice given to you by your health care provider. Make sure you discuss any questions you have with your health care provider. Document Revised: 11/04/2018 Document Reviewed: 11/04/2018 Elsevier Patient Education  [place].

## 2019-09-12 LAB — URINE CULTURE
MICRO NUMBER:: 10245998
SPECIMEN QUALITY:: ADEQUATE

## 2019-09-23 ENCOUNTER — Encounter: Payer: Self-pay | Admitting: Certified Nurse Midwife

## 2020-01-19 ENCOUNTER — Other Ambulatory Visit: Payer: Self-pay | Admitting: Family Medicine

## 2020-01-19 ENCOUNTER — Other Ambulatory Visit: Payer: Self-pay | Admitting: Certified Nurse Midwife

## 2020-01-19 DIAGNOSIS — Z1231 Encounter for screening mammogram for malignant neoplasm of breast: Secondary | ICD-10-CM

## 2020-02-18 ENCOUNTER — Ambulatory Visit
Admission: RE | Admit: 2020-02-18 | Discharge: 2020-02-18 | Disposition: A | Discharge status: Home / Self Care | Payer: BC Managed Care – PPO | Source: Ambulatory Visit | Attending: Family Medicine | Admitting: Family Medicine

## 2020-02-18 ENCOUNTER — Other Ambulatory Visit: Payer: Self-pay

## 2020-02-18 DIAGNOSIS — Z1231 Encounter for screening mammogram for malignant neoplasm of breast: Secondary | ICD-10-CM | POA: Diagnosis not present

## 2020-02-19 LAB — HM MAMMOGRAPHY

## 2020-02-20 ENCOUNTER — Encounter: Payer: Self-pay | Admitting: Family Medicine

## 2020-03-22 ENCOUNTER — Telehealth: Payer: Self-pay | Admitting: *Deleted

## 2020-03-22 ENCOUNTER — Other Ambulatory Visit: Payer: Self-pay

## 2020-03-22 ENCOUNTER — Encounter: Payer: Self-pay | Admitting: Obstetrics and Gynecology

## 2020-03-22 ENCOUNTER — Ambulatory Visit (INDEPENDENT_AMBULATORY_CARE_PROVIDER_SITE_OTHER): Payer: BC Managed Care – PPO | Admitting: Obstetrics and Gynecology

## 2020-03-22 VITALS — BP 90/66 | HR 72 | Ht 67.0 in | Wt 146.0 lb

## 2020-03-22 DIAGNOSIS — Z01419 Encounter for gynecological examination (general) (routine) without abnormal findings: Secondary | ICD-10-CM

## 2020-03-22 DIAGNOSIS — N6324 Unspecified lump in the left breast, lower inner quadrant: Secondary | ICD-10-CM

## 2020-03-22 NOTE — Patient Instructions (Signed)

## 2020-03-22 NOTE — Progress Notes (Signed)
64 y.o. G18P1001 Married White or Caucasian Not Hispanic or Latino female here for annual exam.  H/O supracervical hysterectomy.  No bowel or bladder c/o.    Patient's last menstrual period was 12/31/1999.          Sexually active: No.  The current method of family planning is none.    Exercising: Yes.    walking 5 miles a day Smoker:  no  Health Maintenance: Pap:  09/10/18 negative, negative HPV; 06-19-16 neg (supracervical hysterectomy) History of Abnormal Pap: no  History of abnormal Pap:  no MMG:  02/19/20 density C Bi-rads 1 neg  BMD:   2018  -1.1 right hip Colonoscopy: 2017 10 years  TDaP:  2017 Gardasil: NA   reports that she has never smoked. She has never used smokeless tobacco. She reports current alcohol use of about 2.0 - 3.0 standard drinks of alcohol per week. She reports that she does not use drugs. Retired, Psychologist, occupational work. 2 daughters (one adopted). No grandchildren. Elderly parents out of state.   Past Medical History:  Diagnosis Date  . Endometriosis   . Fibroid   . Osteopenia 07-07-13   spine only -1.2  . Scabies     Past Surgical History:  Procedure Laterality Date  . ABDOMINAL HYSTERECTOMY  2001   LAVH retained ovaries  . EYE SURGERY     laser procedure in office  . hysteroscopic resection  4/00  . VARICOSE VEIN SURGERY  2011,2012,2013    Current Outpatient Medications  Medication Sig Dispense Refill  . BIOTIN PO Take by mouth.    . Calcium-Vitamin D-Vitamin K (VIACTIV PO) Take by mouth as needed.    . Cholecalciferol (VITAMIN D PO) Take by mouth daily.    . fluticasone (FLONASE) 50 MCG/ACT nasal spray Place into both nostrils daily.     No current facility-administered medications for this visit.    Family History  Problem Relation Age of Onset  . Heart disease Father   . Stroke Father   . Cancer Father        prostate  . Hypertension Mother   . Heart attack Mother        pacemaker  . Cancer Maternal Grandfather        liver    Review  of Systems  Exam:   BP 90/66   Pulse 72   Ht 5\' 7"  (1.702 m)   Wt 146 lb (66.2 kg)   LMP 12/31/1999   SpO2 98%   BMI 22.87 kg/m   Weight change: @WEIGHTCHANGE @ Height:   Height: 5\' 7"  (170.2 cm)  Ht Readings from Last 3 Encounters:  03/22/20 5\' 7"  (1.702 m)  09/10/18 5' 7.25" (1.708 m)  08/29/17 5' 7.25" (1.708 m)    General appearance: alert, cooperative and appears stated age Head: Normocephalic, without obvious abnormality, atraumatic Neck: no adenopathy, supple, symmetrical, trachea midline and thyroid normal to inspection and palpation Lungs: clear to auscultation bilaterally Cardiovascular: regular rate and rhythm Breasts: in the left breast at 7 o'clock is a smooth mobile lump/ridge of tissue, lima bean sized, not tender. No other lumps, no skin changes. Abdomen: soft, non-tender; non distended,  no masses,  no organomegaly Extremities: extremities normal, atraumatic, no cyanosis or edema Skin: Skin color, texture, turgor normal. No rashes or lesions Lymph nodes: Cervical, supraclavicular, and axillary nodes normal. No abnormal inguinal nodes palpated Neurologic: Grossly normal   Pelvic: External genitalia:  no lesions  Urethra:  normal appearing urethra with no masses, tenderness or lesions              Bartholins and Skenes: normal                 Vagina: normal appearing vagina with normal color and discharge, no lesions              Cervix: no lesions               Bimanual Exam:  Uterus:  uterus absent              Adnexa: no mass, fullness, tenderness               Rectovaginal: Confirms               Anus:  normal sphincter tone, no lesions  Terence Lux chaperoned for the exam.  A:  Well Woman with normal exam  Breast lump left breast 7 o'clock  P:   No pap this year  Mammogram and colonoscopy are UTD  Will set up diagnostic imaging for the left breast  Labs with primary  Discussed breast self exam  Discussed calcium and vit D  intake

## 2020-03-22 NOTE — Telephone Encounter (Signed)
-----   Message from Salvadore Dom, MD sent at 03/22/2020 10:04 AM EDT ----- Please schedule diagnostic imaging, left breast 7-8 o'clock Thanks, Sharee Pimple

## 2020-03-22 NOTE — Telephone Encounter (Signed)
Spoke with Katherine Kerr at [facility].  Patient scheduled for left breast Dx MMG and left breast US on 04/05/20 at 2:40pm, arrive at 2:20pm.   Spoke with patient, patient states she will need to change appt, she will contact [facility] directly to reschedule. Contact information provided. Patient aware to call office if any additional questions.   Placed in [place] hold.   Encounter closed.

## 2020-03-25 DIAGNOSIS — H401131 Primary open-angle glaucoma, bilateral, mild stage: Secondary | ICD-10-CM | POA: Diagnosis not present

## 2020-04-05 ENCOUNTER — Other Ambulatory Visit: Payer: BC Managed Care – PPO

## 2020-04-05 ENCOUNTER — Inpatient Hospital Stay: Admission: RE | Admit: 2020-04-05 | Payer: BC Managed Care – PPO | Source: Ambulatory Visit

## 2020-04-06 ENCOUNTER — Other Ambulatory Visit: Payer: BC Managed Care – PPO

## 2020-04-06 DIAGNOSIS — H401131 Primary open-angle glaucoma, bilateral, mild stage: Secondary | ICD-10-CM | POA: Diagnosis not present

## 2020-04-13 ENCOUNTER — Other Ambulatory Visit: Payer: Self-pay

## 2020-04-13 ENCOUNTER — Ambulatory Visit
Admission: RE | Admit: 2020-04-13 | Discharge: 2020-04-13 | Disposition: A | Discharge status: Home / Self Care | Payer: BC Managed Care – PPO | Source: Ambulatory Visit | Attending: Obstetrics and Gynecology | Admitting: Obstetrics and Gynecology

## 2020-04-13 DIAGNOSIS — R922 Inconclusive mammogram: Secondary | ICD-10-CM | POA: Diagnosis not present

## 2020-04-13 DIAGNOSIS — N6324 Unspecified lump in the left breast, lower inner quadrant: Secondary | ICD-10-CM

## 2020-04-13 DIAGNOSIS — N6489 Other specified disorders of breast: Secondary | ICD-10-CM | POA: Diagnosis not present

## 2020-06-24 DIAGNOSIS — Z20822 Contact with and (suspected) exposure to covid-19: Secondary | ICD-10-CM | POA: Diagnosis not present

## 2020-10-13 ENCOUNTER — Telehealth: Payer: Self-pay | Admitting: Family Medicine

## 2020-10-13 NOTE — Telephone Encounter (Signed)
Patient is calling and stated that she is going out of the country for 3 weeks and wanted to see if provider could prescribe her an antibiotic just to have in case of her becoming ill. Pt declined appointment and wanted to ask provider before making an appointment if one is needed, please advise. CB is [phone]

## 2020-10-13 NOTE — Telephone Encounter (Signed)
Pt needs to schedule an appt to discussed further.

## 2020-10-17 ENCOUNTER — Telehealth (INDEPENDENT_AMBULATORY_CARE_PROVIDER_SITE_OTHER): Payer: Self-pay | Admitting: Family Medicine

## 2020-10-17 ENCOUNTER — Encounter: Payer: Self-pay | Admitting: Family Medicine

## 2020-10-17 ENCOUNTER — Other Ambulatory Visit: Payer: Self-pay

## 2020-10-17 DIAGNOSIS — Z7184 Encounter for health counseling related to travel: Secondary | ICD-10-CM

## 2020-10-17 MED ORDER — CIPROFLOXACIN HCL 500 MG PO TABS: 500.0000 mg | ORAL_TABLET | Freq: Two times a day (BID) | ORAL | 0 refills | Status: DC

## 2020-10-17 NOTE — Progress Notes (Signed)
Traveling to [place]

## 2020-10-17 NOTE — Telephone Encounter (Signed)
Patient had a virtual visit 10/17/20.

## 2020-10-17 NOTE — Progress Notes (Signed)
Virtual Visit via Video Note  I connected with Katherine Kerr on 10/17/20 at 11:30 AM EDT by a video enabled telemedicine application 2/2 [id number]-03 pandemic and verified that I am speaking with the correct person using two identifiers.  Location patient: home Location provider:work or home office Persons participating in the virtual visit: patient, provider  I discussed the limitations of evaluation and management by telemedicine and the availability of in person appointments. The patient expressed understanding and agreed to proceed.   HPI: Pt [place] in [place].  Pt will be gone 3 wks.  She wants to get a Zpak just in case.  Will be stopping in small towns and spending the night in hotels along the way. Pt currently walking 7 miles per day.  Breaking in new hiking shoes.  Using flonase which seems to help prevent AOM while flying.  ROS: See pertinent positives and negatives per HPI.  Past Medical History:  Diagnosis Date  . Endometriosis   . Fibroid   . Osteopenia 07-07-13   spine only -1.2  . Scabies     Past Surgical History:  Procedure Laterality Date  . ABDOMINAL HYSTERECTOMY  2001   LAVH retained ovaries  . EYE SURGERY     laser procedure in office  . hysteroscopic resection  4/00  . VARICOSE VEIN SURGERY  2011,2012,2013    Family History  Problem Relation Age of Onset  . Heart disease Father   . Stroke Father   . Cancer Father        prostate  . Hypertension Mother   . Heart attack Mother        pacemaker  . Cancer Maternal Grandfather        liver      Current Outpatient Medications:  .  BIOTIN PO, Take by mouth., Disp: , Rfl:  .  Calcium-Vitamin D-Vitamin K (VIACTIV PO), Take by mouth as needed., Disp: , Rfl:  .  Cholecalciferol (VITAMIN D PO), Take by mouth daily., Disp: , Rfl:  .  fluticasone (FLONASE) 50 MCG/ACT nasal spray, Place into both nostrils daily., Disp: , Rfl:   EXAM:  VITALS per patient if applicable: RR  between 12-20 bpm  GENERAL: alert, oriented, appears well and in no acute distress  HEENT: atraumatic, conjunctiva clear, no obvious abnormalities on inspection of external nose and ears  NECK: normal movements of the head and neck  LUNGS: on inspection no signs of respiratory distress, breathing rate appears normal, no obvious gross SOB, gasping or wheezing  CV: no obvious cyanosis  MS: moves all visible extremities without noticeable abnormality  PSYCH/NEURO: pleasant and cooperative, no obvious depression or anxiety, speech and thought processing grossly intact  ASSESSMENT AND PLAN:  Discussed the following assessment and plan:  Travel advice encounter  -reviewed travel advisory for [place], high COVID rates -discussed supportive hiking shoes, Water bottle with filter, clothing -discussed rx for cipro for unrelieved severe diarrhea only. -continue flonase. - Plan: ciprofloxacin (CIPRO) 500 MG tablet   I discussed the assessment and treatment plan with the patient. The patient was provided an opportunity to ask questions and all were answered. The patient agreed with the plan and demonstrated an understanding of the instructions.   The patient was advised to call back or seek an in-person evaluation if the symptoms worsen or if the condition fails to improve as anticipated.  Billie Ruddy, MD

## 2020-10-29 DIAGNOSIS — Z20822 Contact with and (suspected) exposure to covid-19: Secondary | ICD-10-CM | POA: Diagnosis not present

## 2021-03-14 ENCOUNTER — Ambulatory Visit (INDEPENDENT_AMBULATORY_CARE_PROVIDER_SITE_OTHER): Payer: Medicare Other | Admitting: Family Medicine

## 2021-03-14 ENCOUNTER — Other Ambulatory Visit: Payer: Self-pay

## 2021-03-14 ENCOUNTER — Ambulatory Visit (INDEPENDENT_AMBULATORY_CARE_PROVIDER_SITE_OTHER)
Admission: RE | Admit: 2021-03-14 | Discharge: 2021-03-14 | Disposition: A | Discharge status: Home / Self Care | Payer: Medicare Other | Source: Ambulatory Visit | Attending: Family Medicine | Admitting: Family Medicine

## 2021-03-14 ENCOUNTER — Encounter: Payer: Self-pay | Admitting: Family Medicine

## 2021-03-14 VITALS — BP 118/70 | HR 80 | Resp 16 | Ht 67.0 in | Wt 143.0 lb

## 2021-03-14 DIAGNOSIS — R109 Unspecified abdominal pain: Secondary | ICD-10-CM

## 2021-03-14 DIAGNOSIS — R1011 Right upper quadrant pain: Secondary | ICD-10-CM

## 2021-03-14 LAB — URINALYSIS, ROUTINE W REFLEX MICROSCOPIC
Bilirubin Urine: NEGATIVE
Hgb urine dipstick: NEGATIVE
Ketones, ur: NEGATIVE
Nitrite: NEGATIVE
Specific Gravity, Urine: 1.01 (ref 1.000–1.030)
Total Protein, Urine: NEGATIVE
Urine Glucose: NEGATIVE
Urobilinogen, UA: 0.2 (ref 0.0–1.0)
pH: 7.5 (ref 5.0–8.0)

## 2021-03-14 LAB — COMPREHENSIVE METABOLIC PANEL
ALT: 16 U/L (ref 0–35)
AST: 16 U/L (ref 0–37)
Albumin: 4.5 g/dL (ref 3.5–5.2)
Alkaline Phosphatase: 72 U/L (ref 39–117)
BUN: 13 mg/dL (ref 6–23)
CO2: 31 mEq/L (ref 19–32)
Calcium: 10.3 mg/dL (ref 8.4–10.5)
Chloride: 100 mEq/L (ref 96–112)
Creatinine, Ser: 0.76 mg/dL (ref 0.40–1.20)
GFR: 82.3 mL/min (ref >60.00)
Glucose, Bld: 71 mg/dL (ref 70–99)
Potassium: 3.8 mEq/L (ref 3.5–5.1)
Sodium: 140 mEq/L (ref 135–145)
Total Bilirubin: 0.5 mg/dL (ref 0.2–1.2)
Total Protein: 7.4 g/dL (ref 6.0–8.3)

## 2021-03-14 LAB — CBC
HCT: 42.8 % (ref 36.0–46.0)
Hemoglobin: 14.4 g/dL (ref 12.0–15.0)
MCHC: 33.6 g/dL (ref 30.0–36.0)
MCV: 96.2 fl (ref 78.0–100.0)
Platelets: 300 10*3/uL (ref 150.0–400.0)
RBC: 4.45 Mil/uL (ref 3.87–5.11)
RDW: 13.2 % (ref 11.5–15.5)
WBC: 7.4 10*3/uL (ref 4.0–10.5)

## 2021-03-14 NOTE — Progress Notes (Signed)
ACUTE VISIT Chief Complaint  Patient presents with   Nausea   pain along right side    Since Sunday   HPI: [name] is a 65 y.o. female, who is here today complaining of 2 days of right flank and nausea as described above. RUQ abdominal and flank dull pain, 3-4/10. Alleviated by sitting down. Prior history of nephrolithiasis. She has not noted gross hematuria or vomiting. Very mild nausea. She had some changes in bowel habits, mild constipation, attributed to be out of town. Last bowel movement today. Negative for blood or melena. No heartburn.  Flank Pain This is a new problem. The current episode started in the past 7 days. The problem is unchanged. The pain is present in the lumbar spine. The pain is at a severity of 4/10. The pain is Worse during the day. The symptoms are aggravated by standing. Associated symptoms include abdominal pain. Pertinent negatives include no bladder incontinence, bowel incontinence, chest pain, dysuria, fever, leg pain, numbness, paresis, paresthesias, pelvic pain or perianal numbness. Risk factors include menopause. She has tried nothing for the symptoms.   Problem is not aggravated by food intake and not alleviated by defecation.  Review of Systems  Constitutional:  Negative for activity change, appetite change, fatigue and fever.  HENT:  Negative for mouth sores, sore throat and trouble swallowing.   Respiratory:  Negative for cough, shortness of breath and wheezing.   Cardiovascular:  Negative for chest pain.  Gastrointestinal:  Positive for abdominal pain. Negative for bowel incontinence and vomiting.  Genitourinary:  Positive for flank pain. Negative for bladder incontinence, dysuria and pelvic pain.  Musculoskeletal:  Negative for gait problem.  Skin:  Negative for pallor and rash.  Neurological:  Negative for numbness and paresthesias.  Hematological:  Negative for adenopathy. Does not bruise/bleed easily.  Rest see pertinent  positives and negatives per HPI.  Current Outpatient Medications on File Prior to Visit  Medication Sig Dispense Refill   BIOTIN PO Take by mouth.     Calcium-Vitamin D-Vitamin K (VIACTIV PO) Take by mouth as needed.     Cholecalciferol (VITAMIN D PO) Take by mouth daily.     fluticasone (FLONASE) 50 MCG/ACT nasal spray Place into both nostrils daily.     No current facility-administered medications on file prior to visit.   Past Medical History:  Diagnosis Date   Endometriosis    Fibroid    Osteopenia 07-07-13   spine only -1.2   Scabies    Allergies  Allergen Reactions   Anectine [Succinylcholine Chloride]    Amoxicillin-Pot Clavulanate Rash    Social History   Socioeconomic History   Marital status: Married    Spouse name: Not on file   Number of children: Not on file   Years of education: Not on file   Highest education level: Not on file  Occupational History   Not on file  Tobacco Use   Smoking status: Never   Smokeless tobacco: Never  Substance and Sexual Activity   Alcohol use: Yes    Alcohol/week: 2.0 - 3.0 standard drinks    Types: 2 - 3 Standard drinks or equivalent per week   Drug use: No   Sexual activity: Not Currently    Partners: Male    Birth control/protection: Surgical    Comment: LAVH  Other Topics Concern   Not on file  Social History Narrative   Not on file   Social Determinants of Health   Financial Resource Strain:  Not on file  Food Insecurity: Not on file  Transportation Needs: Not on file  Physical Activity: Not on file  Stress: Not on file  Social Connections: Not on file    Vitals:   03/14/21 1158  BP: 118/70  Pulse: 80  Resp: 16  SpO2: 98%   Body mass index is 22.4 kg/m.  Physical Exam Vitals and nursing note reviewed.  Constitutional:      General: She is not in acute distress.    Appearance: She is well-developed. She is not ill-appearing.  HENT:     Head: Normocephalic and atraumatic.     Mouth/Throat:      Mouth: Mucous membranes are dry.     Pharynx: Oropharynx is clear.  Eyes:     General: No scleral icterus.    Conjunctiva/sclera: Conjunctivae normal.  Cardiovascular:     Rate and Rhythm: Normal rate and regular rhythm.     Heart sounds: No murmur heard. Pulmonary:     Effort: Pulmonary effort is normal. No respiratory distress.     Breath sounds: Normal breath sounds.  Abdominal:     General: Bowel sounds are normal. There is no distension.     Palpations: Abdomen is soft. There is no hepatomegaly or mass.     Tenderness: There is abdominal tenderness in the right upper quadrant. There is no right CVA tenderness or left CVA tenderness.  Lymphadenopathy:     Cervical: No cervical adenopathy.     Upper Body:     Right upper body: No supraclavicular adenopathy.     Left upper body: No supraclavicular adenopathy.  Skin:    General: Skin is warm.     Findings: No erythema or rash.  Neurological:     Mental Status: She is alert and oriented to person, place, and time.  Psychiatric:        Mood and Affect: Mood and affect normal.     Comments: Well groomed, good eye contact.   ASSESSMENT AND PLAN:  [name] was seen today for nausea and pain along right side.  Diagnoses and all orders for this visit: Orders Placed This Encounter  Procedures   DG Abd 1 View   Urinalysis, Routine w reflex microscopic   Comprehensive metabolic panel   CBC   Lab Results  Component Value Date   CREATININE 0.76 03/14/2021   BUN 13 03/14/2021   NA 140 03/14/2021   K 3.8 03/14/2021   CL 100 03/14/2021   CO2 31 03/14/2021   Lab Results  Component Value Date   WBC 7.4 03/14/2021   HGB 14.4 03/14/2021   HCT 42.8 03/14/2021   MCV 96.2 03/14/2021   PLT 300.0 03/14/2021   Lab Results  Component Value Date   ALT 16 03/14/2021   AST 16 03/14/2021   ALKPHOS 72 03/14/2021   BILITOT 0.5 03/14/2021   RUQ abdominal pain We discussed possible etiologies: Nephrolithiasis,musculoskeletal,and gall  bladder disease among some. No signs of acute abdomen, so I do not think imaging is needed at this time. She does not think she needs something for nausea, it is not affecting oral intake. Adequate fiber and fluid intake. Instructed to monitor for new symptoms, including rash. Clearly instructed about warning signs.  Flank pain, acute Adequate hydration. For pain management she can alternate between Tylenol and Ibuprofen. Urine strainer given. Further recommendations according to KUB and UA.  Return if symptoms worsen or fail to improve.   Keyauna Graefe G. Martinique, MD  [facility]. [place]  office.

## 2021-03-14 NOTE — Patient Instructions (Signed)
A few things to remember from today's visit:   Flank pain, acute - Plan: Urinalysis, Routine w reflex microscopic, DG Abd 1 View  RUQ abdominal pain - Plan: Urinalysis, Routine w reflex microscopic, Comprehensive metabolic panel, CBC  If you need refills please call your pharmacy. Do not use My Chart to request refills or for acute issues that need immediate attention.   Monitor for new symptoms. ? Kidney stone. Follow a bland diet for the next few days, small meals, adequate hydration.   GET HELP RIGHT AWAY IF:  Sudden severe/worsening pain. You keep throwing up (vomiting). The pain changes and is only in the right or left part of the belly. Not being able to pass gas or poop. You have bloody or tarry looking poop.  MAKE SURE YOU:  Understand these instructions. Will watch your condition. Will get help right away if you are not doing well or get worse.   If symptoms are persistent please arrange a follow up appointment.    Please be sure medication list is accurate. If a new problem present, please set up appointment sooner than planned today.

## 2021-03-16 ENCOUNTER — Encounter: Payer: Self-pay | Admitting: Family Medicine

## 2021-03-16 ENCOUNTER — Telehealth: Payer: Self-pay | Admitting: Family Medicine

## 2021-03-16 NOTE — Telephone Encounter (Signed)
PT called to advise she notice her xray results were I and would like to get a callback in regards to them to go over them and find out what she needs to do.

## 2021-03-17 ENCOUNTER — Other Ambulatory Visit: Payer: Self-pay | Admitting: Family Medicine

## 2021-03-17 DIAGNOSIS — R1011 Right upper quadrant pain: Secondary | ICD-10-CM

## 2021-03-17 DIAGNOSIS — R109 Unspecified abdominal pain: Secondary | ICD-10-CM

## 2021-03-17 NOTE — Telephone Encounter (Signed)
Questions were directed to Dr Martinique, pt has been advised on next steps.

## 2021-03-17 NOTE — Telephone Encounter (Signed)
PT called wanting to get a callback in regards to her xray results. Please advise as they would like to get a call before the weekend so they know what to do.

## 2021-03-17 NOTE — Telephone Encounter (Signed)
KUB reviewed. Because some non specific abnormalities (calcifications) were seen, abdominal/pelvic CT was ordered. Thanks, BJ

## 2021-03-17 NOTE — Telephone Encounter (Signed)
See result note.  

## 2021-03-20 NOTE — Addendum Note (Signed)
Addended by: Nathanial Millman E on: 03/20/2021 04:01 PM   Modules accepted: Orders

## 2021-03-21 NOTE — Addendum Note (Signed)
Addended by: Rodrigo Ran on: 03/21/2021 08:33 AM   Modules accepted: Orders

## 2021-03-22 ENCOUNTER — Ambulatory Visit
Admission: RE | Admit: 2021-03-22 | Discharge: 2021-03-22 | Disposition: A | Discharge status: Home / Self Care | Payer: Medicare Other | Source: Ambulatory Visit | Attending: Family Medicine | Admitting: Family Medicine

## 2021-03-22 DIAGNOSIS — R109 Unspecified abdominal pain: Secondary | ICD-10-CM

## 2021-03-22 DIAGNOSIS — R1011 Right upper quadrant pain: Secondary | ICD-10-CM

## 2021-03-23 ENCOUNTER — Encounter: Payer: Self-pay | Admitting: Family Medicine

## 2021-03-23 ENCOUNTER — Telehealth (INDEPENDENT_AMBULATORY_CARE_PROVIDER_SITE_OTHER): Payer: Medicare Other | Admitting: Family Medicine

## 2021-03-23 DIAGNOSIS — R9389 Abnormal findings on diagnostic imaging of other specified body structures: Secondary | ICD-10-CM | POA: Diagnosis not present

## 2021-03-23 DIAGNOSIS — R1900 Intra-abdominal and pelvic swelling, mass and lump, unspecified site: Secondary | ICD-10-CM | POA: Diagnosis not present

## 2021-03-23 NOTE — Telephone Encounter (Signed)
PT has seen their results on their mychart and want to know if the CT scan is being set up. She wants a callback before her apt to make sure it is before having to wait til end of day or tomorrow for it to be set up. Please advise and call PT.

## 2021-03-23 NOTE — Progress Notes (Signed)
Virtual Visit via Video Note  I connected with Katherine Kerr on 03/23/21 at  4:30 PM EDT by a video enabled telemedicine application 2/2 [id number] pandemic and verified that I am speaking with the correct person using two identifiers.  Location patient: home Location provider:work or home office Persons participating in the virtual visit: patient, provider  I discussed the limitations of evaluation and management by telemedicine and the availability of in person appointments. The patient expressed understanding and agreed to proceed.   HPI: Pt seen last wk by Dr. Martinique for acute R flank pain.  UA, labs and xray ordered 2/2 concern for renal calculi.  A calcification was noted and f/u CT advised.  CT renal stone study done yesterday with results released to pt overnight.  Cystic mass noted, further eval with pelvic u/s advised.  No renal calculus noted.  Pt anxious about the results and next steps.  Pt has an appt with OB/Gyn in Nov. for well woman exam.  ROS: See pertinent positives and negatives per HPI.  Past Medical History:  Diagnosis Date   Endometriosis    Fibroid    Osteopenia 07-07-13   spine only -1.2   Scabies     Past Surgical History:  Procedure Laterality Date   ABDOMINAL HYSTERECTOMY  2001   LAVH retained ovaries   EYE SURGERY     laser procedure in office   hysteroscopic resection  4/00   VARICOSE VEIN SURGERY  2011,2012,2013    Family History  Problem Relation Age of Onset   Heart disease Father    Stroke Father    Cancer Father        prostate   Hypertension Mother    Heart attack Mother        pacemaker   Cancer Maternal Grandfather        liver     Current Outpatient Medications:    BIOTIN PO, Take by mouth., Disp: , Rfl:    Calcium-Vitamin D-Vitamin K (VIACTIV PO), Take by mouth as needed., Disp: , Rfl:    Cholecalciferol (VITAMIN D PO), Take by mouth daily., Disp: , Rfl:    fluticasone (FLONASE) 50 MCG/ACT nasal spray, Place into both nostrils  daily., Disp: , Rfl:   EXAM:  VITALS per patient if applicable: RR between 37-90 bpm  GENERAL: alert, oriented, appears well and in no acute distress  HEENT: atraumatic, conjunctiva clear, no obvious abnormalities on inspection of external nose and ears  NECK: normal movements of the head and neck  LUNGS: on inspection no signs of respiratory distress, breathing rate appears normal, no obvious gross SOB, gasping or wheezing  CV: no obvious cyanosis  MS: moves all visible extremities without noticeable abnormality  PSYCH/NEURO: pleasant and cooperative, no obvious depression or anxiety, speech and thought processing grossly intact  ASSESSMENT AND PLAN:  Discussed the following assessment and plan:  Abnormal CT scan  -CT renal stone study 03/22/2021: Cystic mass measuring 8.8 cm within the pelvic cul-de-sac.  Findings favored as ovarian in origin but indeterminate.  Recommended further evaluation with pelvic ultrasound - Plan: US Pelvic Complete With Transvaginal  Pelvic mass -Cystic mass as noted above on CT renal stone study from 03/22/2021 -pt s/p LAVH with retained ovaries in 2021 -We will proceed with transvaginal ultrasound -Likely follow-up with gynecology vs gyn onc based on results  - Plan: US Pelvic Complete With Transvaginal  F/u prn   I discussed the assessment and treatment plan with the patient. The patient was provided an  opportunity to ask questions and all were answered. The patient agreed with the plan and demonstrated an understanding of the instructions.   The patient was advised to call back or seek an in-person evaluation if the symptoms worsen or if the condition fails to improve as anticipated.  Billie Ruddy, MD

## 2021-03-23 NOTE — Telephone Encounter (Signed)
Pt has virtual visit today.

## 2021-03-24 ENCOUNTER — Telehealth: Payer: Self-pay | Admitting: Family Medicine

## 2021-03-24 ENCOUNTER — Other Ambulatory Visit: Payer: Medicare Other

## 2021-03-24 ENCOUNTER — Telehealth: Payer: Self-pay

## 2021-03-24 NOTE — Telephone Encounter (Signed)
Patient requested pain medication for the visit yesterday.  Patient could be contacted at [phone].  Please advise.

## 2021-03-24 NOTE — Telephone Encounter (Signed)
PT called to advise that she is still waiting to see if Dr.Banks will call in any medication for her pain so she has something to take over the weekend. Please advise.

## 2021-03-24 NOTE — Telephone Encounter (Signed)
Patient called the office today to discuss her referral for [place] at GI. I left her a VM requesting a call back to discuss. If she is just looking to get scheduled, she can call [facility] and schedule with them directly at [phone]. This does not require a PA

## 2021-03-27 ENCOUNTER — Ambulatory Visit
Admission: RE | Admit: 2021-03-27 | Discharge: 2021-03-27 | Disposition: A | Discharge status: Home / Self Care | Payer: Medicare Other | Source: Ambulatory Visit | Attending: Family Medicine | Admitting: Family Medicine

## 2021-03-27 DIAGNOSIS — R1900 Intra-abdominal and pelvic swelling, mass and lump, unspecified site: Secondary | ICD-10-CM

## 2021-03-27 DIAGNOSIS — R9389 Abnormal findings on diagnostic imaging of other specified body structures: Secondary | ICD-10-CM

## 2021-03-29 ENCOUNTER — Encounter: Payer: Self-pay | Admitting: Obstetrics and Gynecology

## 2021-03-29 ENCOUNTER — Other Ambulatory Visit: Payer: Self-pay

## 2021-03-29 ENCOUNTER — Ambulatory Visit (INDEPENDENT_AMBULATORY_CARE_PROVIDER_SITE_OTHER): Payer: Medicare Other | Admitting: Obstetrics and Gynecology

## 2021-03-29 ENCOUNTER — Telehealth: Payer: Self-pay | Admitting: *Deleted

## 2021-03-29 VITALS — BP 120/68 | HR 77 | Ht 67.5 in | Wt 147.0 lb

## 2021-03-29 DIAGNOSIS — R1900 Intra-abdominal and pelvic swelling, mass and lump, unspecified site: Secondary | ICD-10-CM

## 2021-03-29 DIAGNOSIS — R109 Unspecified abdominal pain: Secondary | ICD-10-CM

## 2021-03-29 DIAGNOSIS — D399 Neoplasm of uncertain behavior of female genital organ, unspecified: Secondary | ICD-10-CM

## 2021-03-29 MED ORDER — IBUPROFEN 800 MG PO TABS: 800.0000 mg | ORAL_TABLET | Freq: Three times a day (TID) | ORAL | 1 refills | Status: DC | PRN

## 2021-03-29 NOTE — Telephone Encounter (Signed)
-----   Message from Salvadore Dom, MD sent at 03/29/2021  4:19 PM EDT ----- This PMP patient has an 8 cm fixed cystic mass in her pelvis. Prior supracervical hysterectomy. Please place referral to Dr Berline Lopes.  CA 125 pending.  Thanks, Sharee Pimple

## 2021-03-29 NOTE — Telephone Encounter (Signed)
Referral placed at [facility] [place] [facility] oncology they will call patient to schedule.

## 2021-03-29 NOTE — Progress Notes (Signed)
GYNECOLOGY  VISIT   HPI: 65 y.o.   Married White or Caucasian Not Hispanic or Latino  female   [id number] with Patient's last menstrual period was 12/31/1999.   here for cystic mass see on [place].   She was evaluated for right flank pain. The pain has been intermittent over the last few weeks. No pelvic pain.    The patient had a CT last week looking for a kidney stone and an 8.8 cm cystic mass was noted in the cul de sac.   03/27/21 pelvic ultrasound: FINDINGS: Uterus   Surgically absent   Endometrium   Surgically absent   Right ovary   No normal appearing RIGHT ovary visualized, see below   Left ovary   No normal appearing LEFT ovary visualized, see below   Other findings   No free pelvic fluid. Cystic mass identified within LEFT adnexa, 7.9 x 6.4 x 8.9 cm in size, containing a single thick septation, a 9 mm mural nodule, and scattered internal echoes. No definite color Doppler flow within the septation; color Doppler of the mural nodule was not obtained.   IMPRESSION: Complex cystic adnexal mass potentially of LEFT ovarian origin, 8.9 cm greatest diameter.   Internal vascularity within the mural nodule by color Doppler imaging was not established.   Either surgical evaluation or potentially further characterization by MR imaging recommended.   The patient has a h/o a supracervical hysterectomy.  GYNECOLOGIC HISTORY: Patient's last menstrual period was 12/31/1999. Contraception:Hysterectomy  Menopausal hormone therapy: none         OB History     Gravida  1   Para  1   Term  1   Preterm      AB      Living  1      SAB      IAB      Ectopic      Multiple      Live Births  1        Obstetric Comments  1 adopted            Patient Active Problem List   Diagnosis Date Noted   Eustachian tube dysfunction, bilateral 01/10/2017    Past Medical History:  Diagnosis Date   Endometriosis    Fibroid    Osteopenia 07-07-13   spine only  -1.2   Scabies     Past Surgical History:  Procedure Laterality Date   ABDOMINAL HYSTERECTOMY  2001   LAVH retained ovaries   EYE SURGERY     laser procedure in office   hysteroscopic resection  4/00   VARICOSE VEIN SURGERY  2011,2012,2013    Current Outpatient Medications  Medication Sig Dispense Refill   BIOTIN PO Take by mouth.     Calcium-Vitamin D-Vitamin K (VIACTIV PO) Take by mouth as needed.     fluticasone (FLONASE) 50 MCG/ACT nasal spray Place into both nostrils daily.     No current facility-administered medications for this visit.     ALLERGIES: Anectine [succinylcholine chloride] and Amoxicillin-pot clavulanate  Family History  Problem Relation Age of Onset   Heart disease Father    Stroke Father    Cancer Father        prostate   Hypertension Mother    Heart attack Mother        pacemaker   Cancer Maternal Grandfather        liver    Social History   Socioeconomic History   Marital status: Married  Spouse name: Not on file   Number of children: Not on file   Years of education: Not on file   Highest education level: Not on file  Occupational History   Not on file  Tobacco Use   Smoking status: Never   Smokeless tobacco: Never  Substance and Sexual Activity   Alcohol use: Yes    Alcohol/week: 2.0 - 3.0 standard drinks    Types: 2 - 3 Standard drinks or equivalent per week   Drug use: No   Sexual activity: Not Currently    Partners: Male    Birth control/protection: Surgical    Comment: LAVH  Other Topics Concern   Not on file  Social History Narrative   Not on file   Social Determinants of Health   Financial Resource Strain: Not on file  Food Insecurity: Not on file  Transportation Needs: Not on file  Physical Activity: Not on file  Stress: Not on file  Social Connections: Not on file  Intimate Partner Violence: Not on file    Review of Systems  Genitourinary:  Positive for flank pain.   PHYSICAL EXAMINATION:    BP 120/68    Pulse 77   Ht 5' 7.5" (1.715 m)   Wt 147 lb (66.7 kg)   LMP 12/31/1999   SpO2 100%   BMI 22.68 kg/m     General appearance: alert, cooperative and appears stated age Abdomen: soft, non-tender; non distended, no masses,  no organomegaly  Pelvic: External genitalia:  no lesions              Urethra:  normal appearing urethra with no masses, tenderness or lesions              Bartholins and Skenes: normal                 Vagina: normal appearing vagina with normal color and discharge, no lesions              Cervix: no cervical motion tenderness and no lesions              Bimanual Exam:  Uterus:  uterus absent              Adnexa:  fixed, firm,~10 cm tender mass in the cul de sac, deviated slightly to the patient's left. Doesn't feel attached to the cervix which does move.               Rectovaginal: .  Confirms.              Anus:  normal sphincter tone, no lesions  Chaperone was present for exam.  1. Pelvic mass Ultrasound images were reviewed, cystic mass with septation and some nodularity without blood flow.  Fixed on pelvic exam -Will refer to Dr Berline Lopes - CA 125  2. Right flank pain Negative renal CT - ibuprofen (ADVIL) 800 MG tablet; Take 1 tablet (800 mg total) by mouth every 8 (eight) hours as needed.  Dispense: 30 tablet; Refill: 1  3. 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

## 2021-03-30 ENCOUNTER — Telehealth: Payer: Self-pay | Admitting: *Deleted

## 2021-03-30 ENCOUNTER — Telehealth: Payer: Self-pay

## 2021-03-30 ENCOUNTER — Encounter: Payer: Self-pay | Admitting: Gynecologic Oncology

## 2021-03-30 LAB — CA 125: CA 125: 54 U/mL — ABNORMAL HIGH (ref <35)

## 2021-03-30 MED ORDER — CYCLOBENZAPRINE HCL 5 MG PO TABS: 5.0000 mg | ORAL_TABLET | Freq: Three times a day (TID) | ORAL | 0 refills | Status: DC | PRN

## 2021-03-30 NOTE — Telephone Encounter (Signed)
Spoke with the patient and scheduled a new patient appt for 10/3 at 8:15 am. Patient given an arrival time of 7:45 am. Patient given the address and phone number for the clinic; along with the policy for masks and visitors

## 2021-03-30 NOTE — Telephone Encounter (Signed)
Patient advised.

## 2021-03-30 NOTE — Telephone Encounter (Signed)
We did discuss it, but I thought we were going to try the ibuprofen first. I just sent in the flexeril for her to try as well.  Just make sure she knows that it might make her drowsy.

## 2021-03-30 NOTE — Telephone Encounter (Signed)
Patient said a muscle relaxant was discussed yesterday but Rx was not at pharmacy. I do see where Ibuprofen 800 mg was sent in.

## 2021-04-03 ENCOUNTER — Telehealth: Payer: Self-pay

## 2021-04-03 ENCOUNTER — Inpatient Hospital Stay (HOSPITAL_BASED_OUTPATIENT_CLINIC_OR_DEPARTMENT_OTHER): Payer: Medicare Other | Admitting: Gynecologic Oncology

## 2021-04-03 ENCOUNTER — Other Ambulatory Visit: Payer: Self-pay

## 2021-04-03 ENCOUNTER — Inpatient Hospital Stay: Payer: Medicare Other | Attending: Gynecologic Oncology | Admitting: Gynecologic Oncology

## 2021-04-03 ENCOUNTER — Encounter: Payer: Self-pay | Admitting: Gynecologic Oncology

## 2021-04-03 VITALS — BP 124/72 | HR 75 | Temp 98.0°F | Resp 18 | Wt 150.0 lb

## 2021-04-03 DIAGNOSIS — Z90710 Acquired absence of both cervix and uterus: Secondary | ICD-10-CM | POA: Insufficient documentation

## 2021-04-03 DIAGNOSIS — R971 Elevated cancer antigen 125 [CA 125]: Secondary | ICD-10-CM | POA: Diagnosis not present

## 2021-04-03 DIAGNOSIS — D398 Neoplasm of uncertain behavior of other specified female genital organs: Secondary | ICD-10-CM | POA: Diagnosis present

## 2021-04-03 DIAGNOSIS — R1900 Intra-abdominal and pelvic swelling, mass and lump, unspecified site: Secondary | ICD-10-CM

## 2021-04-03 DIAGNOSIS — R140 Abdominal distension (gaseous): Secondary | ICD-10-CM | POA: Insufficient documentation

## 2021-04-03 DIAGNOSIS — R109 Unspecified abdominal pain: Secondary | ICD-10-CM | POA: Insufficient documentation

## 2021-04-03 DIAGNOSIS — R978 Other abnormal tumor markers: Secondary | ICD-10-CM | POA: Insufficient documentation

## 2021-04-03 DIAGNOSIS — N809 Endometriosis, unspecified: Secondary | ICD-10-CM | POA: Insufficient documentation

## 2021-04-03 MED ORDER — TRAMADOL HCL 50 MG PO TABS: 50.0000 mg | ORAL_TABLET | Freq: Four times a day (QID) | ORAL | 0 refills | Status: DC | PRN

## 2021-04-03 MED ORDER — IBUPROFEN 800 MG PO TABS: 800.0000 mg | ORAL_TABLET | Freq: Three times a day (TID) | ORAL | 1 refills | Status: DC | PRN

## 2021-04-03 MED ORDER — SENNOSIDES-DOCUSATE SODIUM 8.6-50 MG PO TABS: 2.0000 | ORAL_TABLET | Freq: Every day | ORAL | 0 refills | Status: DC

## 2021-04-03 NOTE — Patient Instructions (Addendum)
Preparing for your Surgery  Plan for surgery on April 20, 2021 (if the 13th becomes available, we will contact you and move your surgery to this date) with Dr. Jeral Pinch at [place] will be scheduled for robotic assisted laparoscopic bilateral salpingo-oophorectomy (removal of both ovaries and fallopian tubes), possible trachelectomy (removal of the cervix), possible laparotomy (larger incision on your abdomen if needed), possible staging if a cancer is identified including possible biopsies, lymph node removal, omentectomy.    If a cancer is identified, you will be discharged home with blood thinner injections called lovenox. If the surgery is performed laparoscopically, you would need to give yourself once daily injections for a total of 2 weeks. If your surgery is performed through an open incision, it would be for 4 weeks after surgery.  Pre-operative Testing -You will receive a phone call from presurgical testing at [facility] to arrange for a pre-operative appointment and lab work.  -Bring your insurance card, copy of an advanced directive if applicable, medication list  -At that visit, you will be asked to sign a consent for a possible blood transfusion in case a transfusion becomes necessary during surgery.  The need for a blood transfusion is rare but having consent is a necessary part of your care.     -You should not be taking blood thinners or aspirin at least ten days prior to surgery unless instructed by your surgeon.  -Do not take supplements such as fish oil (omega 3), red yeast rice, turmeric before your surgery. You want to avoid medications with aspirin in them including headache powders such as BC or Goody's), Excedrin migraine.  Day Before Surgery at [place] will be asked to take in a light diet the day before surgery. You will be advised you can have clear liquids up until 3 hours before your surgery.    Eat a light diet the day before  surgery.  Examples including soups, broths, toast, yogurt, mashed potatoes.  AVOID GAS PRODUCING FOODS. Things to avoid include carbonated beverages (fizzy beverages, sodas), raw fruits and raw vegetables (uncooked), or beans.   If your bowels are filled with gas, your surgeon will have difficulty visualizing your pelvic organs which increases your surgical risks.  Your role in recovery You should be able to go home the same day of surgery.  Your role is to become active as soon as directed by your doctor, while still giving yourself time to heal.  Rest when you feel tired. You will be asked to do the following in order to speed your recovery:  - Cough and breathe deeply. This helps to clear and expand your lungs and can prevent pneumonia after surgery.  - [place]. Do mild physical activity. Walking or moving your legs help your circulation and body functions return to normal. Do not try to get up or walk alone the first time after surgery.   -If you develop swelling on one leg or the other, pain in the back of your leg, redness/warmth in one of your legs, please call the office or go to the Emergency Room to have a doppler to rule out a blood clot. For shortness of breath, chest pain-seek care in the Emergency Room as soon as possible. - Actively manage your pain. Managing your pain lets you move in comfort. We will ask you to rate your pain on a scale of zero to 10. It is your responsibility to tell your doctor  or nurse where and how much you hurt so your pain can be treated.  Special Considerations -If you are diabetic, you may be placed on insulin after surgery to have closer control over your blood sugars to promote healing and recovery.  This does not mean that you will be discharged on insulin.  If applicable, your oral antidiabetics will be resumed when you are tolerating a solid diet.  -Your final pathology results from surgery should be available around one week after  surgery and the results will be relayed to you when available.  -Dr. Lahoma Crocker is the surgeon that assists your GYN Oncologist with surgery.  If you end up staying the night, the next day after your surgery you will either see Dr. Denman George, Dr. Berline Lopes, or Dr. Lahoma Crocker.  -FMLA forms can be faxed to [phone] and please allow 5-7 business days for completion.  Pain Management After Surgery -You have been prescribed your pain medication and bowel regimen medications before surgery so that you can have these available when you are discharged from the hospital. The pain medication is for use ONLY AFTER surgery and a new prescription will not be given.   -Make sure that you have Tylenol and Ibuprofen at home to use on a regular basis after surgery for pain control. We recommend alternating the medications every hour to six hours since they work differently and are processed in the body differently for pain relief.  -Review the attached handout on narcotic use and their risks and side effects.   Bowel Regimen -You have been prescribed Sennakot-S to take nightly to prevent constipation especially if you are taking the narcotic pain medication intermittently.  It is important to prevent constipation and drink adequate amounts of liquids. You can stop taking this medication when you are not taking pain medication and you are back on your normal bowel routine.  Risks of Surgery Risks of surgery are low but include bleeding, infection, damage to surrounding structures, re-operation, blood clots, and very rarely death.   Blood Transfusion Information (For the consent to be signed before surgery)  We will be checking your blood type before surgery so in case of emergencies, we will know what type of blood you would need.                                            WHAT IS A BLOOD TRANSFUSION?  A transfusion is the replacement of blood or some of its parts. Blood is made up of multiple  cells which provide different functions. Red blood cells carry oxygen and are used for blood loss replacement. White blood cells fight against infection. Platelets control bleeding. Plasma helps clot blood. Other blood products are available for specialized needs, such as hemophilia or other clotting disorders. BEFORE THE TRANSFUSION  Who gives blood for transfusions?  You may be able to donate blood to be used at a later date on yourself (autologous donation). Relatives can be asked to donate blood. This is generally not any safer than if you have received blood from a stranger. The same precautions are taken to ensure safety when a relative's blood is donated. Healthy volunteers who are fully evaluated to make sure their blood is safe. This is blood bank blood. Transfusion therapy is the safest it has ever been in the practice of medicine. Before blood is taken from a donor,  a complete history is taken to make sure that person has no history of diseases nor engages in risky social behavior (examples are intravenous drug use or sexual activity with multiple partners). The donor's travel history is screened to minimize risk of transmitting infections, such as malaria. The donated blood is tested for signs of infectious diseases, such as HIV and hepatitis. The blood is then tested to be sure it is compatible with you in order to minimize the chance of a transfusion reaction. If you or a relative donates blood, this is often done in anticipation of surgery and is not appropriate for emergency situations. It takes many days to process the donated blood. RISKS AND COMPLICATIONS Although transfusion therapy is very safe and saves many lives, the main dangers of transfusion include:  Getting an infectious disease. Developing a transfusion reaction. This is an allergic reaction to something in the blood you were given. Every precaution is taken to prevent this. The decision to have a blood transfusion has  been considered carefully by your caregiver before blood is given. Blood is not given unless the benefits outweigh the risks.  AFTER SURGERY INSTRUCTIONS  Return to work: 4-6 weeks if applicable  Activity: 1. Be up and out of the bed during the day.  Take a nap if needed.  You may walk up steps but be careful and use the hand rail.  Stair climbing will tire you more than you think, you may need to stop part way and rest.   2. No lifting or straining for 6 weeks over 10 pounds. No pushing, pulling, straining for 6 weeks.  3. No driving for 1 week(s).  Do not drive if you are taking narcotic pain medicine and make sure that your reaction time has returned.   4. You can shower as soon as the next day after surgery. Shower daily.  Use your regular soap and water (not directly on the incision) and pat your incision(s) dry afterwards; don't rub.  No tub baths or submerging your body in water until cleared by your surgeon. If you have the soap that was given to you by pre-surgical testing that was used before surgery, you do not need to use it afterwards because this can irritate your incisions.   5. No sexual activity and nothing in the vagina for 4 weeks. IF A TRACHELECTOMY IS PERFORMED, NOTHING IN THE VAGINA FOR 8 WEEKS.  6. You may experience a small amount of clear drainage from your incisions, which is normal.  If the drainage persists, increases, or changes color please call the office.  7. Do not use creams, lotions, or ointments such as neosporin on your incisions after surgery until advised by your surgeon because they can cause removal of the dermabond glue on your incisions.    8. Take Tylenol or ibuprofen first for pain and only use narcotic pain medication for severe pain not relieved by the Tylenol or Ibuprofen.  Monitor your Tylenol intake to a max of 4,000 mg in a 24 hour period. You can alternate these medications after surgery.  Diet: 1. Low sodium Heart Healthy Diet is  recommended but you are cleared to resume your normal (before surgery) diet after your procedure.  2. It is safe to use a laxative, such as Miralax or Colace, if you have difficulty moving your bowels. You have been prescribed Sennakot at bedtime every evening to keep bowel movements regular and to prevent constipation.    Wound Care: 1. Keep clean and  dry.  Shower daily.  Reasons to call the Doctor: Fever - Oral temperature greater than 100.4 degrees Fahrenheit Foul-smelling vaginal discharge Difficulty urinating Nausea and vomiting Increased pain at the site of the incision that is unrelieved with pain medicine. Difficulty breathing with or without chest pain New calf pain especially if only on one side Sudden, continuing increased vaginal bleeding with or without clots.   Contacts: For questions or concerns you should contact:  Dr. Jeral Pinch at [phone]  Joylene John, NP at [phone]  After Hours: call [phone] and have the GYN Oncologist paged/contacted (after 5 pm or on the weekends).  Messages sent via mychart are for non-urgent matters and are not responded to after hours so for urgent needs, please call the after hours number.

## 2021-04-03 NOTE — Telephone Encounter (Signed)
Spoke with Katherine Kerr and told her that the Surgery date will be on 04-20-21 as the earlier date is not available due to [facility] staffing. Pt verbalized understanding.

## 2021-04-03 NOTE — Progress Notes (Signed)
GYNECOLOGIC ONCOLOGY NEW PATIENT CONSULTATION   Patient Name: Katherine Kerr  Patient Age: 65 y.o. Date of Service: 04/03/21 Referring Provider: Dr. Sumner Boast  Primary Care Provider: Billie Ruddy, MD Consulting Provider: Jeral Pinch, MD   Assessment/Plan:  Postmenopausal patient with history of supracervical hysterectomy and questionable endometriosis now with a complex adnexal mass.   Discussed findings on my exam as well as imaging, which show a complex mass without obvious evidence of metastatic disease. While CA-125 is mildly elevated, I reviewed that this is not a test that is approved for diagnosis. Once the decision for surgery has been made, this can help determine if gyn versus gyn oncologist should be performing the surgery. In up to 50% of patients with early ovarian cancer, this lab test can be normal. It can also be elevated in many benign disease processes such as endometriosis.   For both diagnostic and therapeutic purposes, I recommend that we proceed with surgery excision. Plan will be for robotic BSO with removal of adnexal mass, placement in a bag for contained drainage and sending it for frozen section. If mass benign, then only tubes and ovaries will be removed. If mass represents a borderline tumor, we discussed completion surgery with BSO. While completion surgery recommendation include TLH, there have been no reports of recurrence within the uterus. I do not feel that there is a strong indication for trachelectomy in this setting. If malignancy is suspected on frozen section, in addition to BSO, we discussed staging, which may include lymph node sampling and omentectomy, as well as mini-laparotomy for palpation of intra-abdominal and pelvic surfaces.   We discussed plan for a robotic assisted bilateral salpingo-oophorectomy, possible staging including lymph node dissection and omentectomy, possible trachelectomy, possible laparotomy. The risks of surgery were  discussed in detail and she understands these to include infection; wound separation; hernia; vaginal cuff separation, injury to adjacent organs such as bowel, bladder, blood vessels, ureters and nerves; bleeding which may require blood transfusion; anesthesia risk; thromboembolic events; possible death; unforeseen complications; possible need for re-exploration; medical complications such as heart attack, stroke, pleural effusion and pneumonia; and, if full lymphadenectomy is performed the risk of lymphedema and lymphocyst. The patient will receive DVT and antibiotic prophylaxis as indicated. She voiced a clear understanding. She had the opportunity to ask questions. Perioperative instructions were reviewed with her. Prescriptions for post-op medications were sent to her pharmacy of choice.  A copy of this note was sent to the patient's referring provider.   85 minutes of total time was spent for this patient encounter, including preparation, face-to-face counseling with the patient and coordination of care, and documentation of the encounter.   Jeral Pinch, MD  Division of Gynecologic Oncology  Department of Obstetrics and Gynecology  [facility]  ___________________________________________  Chief Complaint: Chief Complaint  Patient presents with   Pelvic mass    History of Present Illness:  Katherine Kerr is a 65 y.o. y.o. female who is seen in consultation at the request of Dr. Talbert Nan for an evaluation of a complex adnexal mass.  The patient reports several weeks of right flank pain that she describes as a "pulling" sensation.  This seems to occur more when she is active, sitting or laying still helps ease the pain.  Describes the pain as sporadic and was intense for the first several days.  At times, pain is sharp.  She has tried Advil that helps relieve the pain somewhat.  She also has  used a heating pad with some improvement.  She endorses a normal  appetite without abdominal bloating or early satiety.  She notes some nausea initially with the pain, denies any more recently and denies any emesis.  She has felt some abdominal "fullness".  She denies any bowel or bladder symptoms.  Patient lives in [place] with her husband.  She reports some social alcohol use, denies any tobacco use.  She is not currently working.  Patient surgical history is notable for a laparoscopic supracervical hysterectomy approximately 25 years ago.  She also underwent some sort of procedure for infertility (she is describes this today as an endometrial ablation but it sounds like it may have been hysteroscopic resection of something).  Given infertility, she underwent fertility treatment and was not able to achieve pregnancy.  Shortly after, she ultimately was able to become pregnant.  She carries a chart diagnosis of endometriosis.  PAST MEDICAL HISTORY:  Past Medical History:  Diagnosis Date   Endometriosis    Fibroid    Osteopenia 07-07-13   spine only -1.2   Scabies      PAST SURGICAL HISTORY:  Past Surgical History:  Procedure Laterality Date   EYE SURGERY     laser procedure in office   hysteroscopic resection  10/1998   SUPRACERVICAL ABDOMINAL HYSTERECTOMY  2001   Supracervical laparoscopic hysterectomy, retained ovaries   VARICOSE VEIN SURGERY  2011,2012,2013    OB/GYN HISTORY:  OB History  Gravida Para Term Preterm AB Living  1 1 1     1   SAB IAB Ectopic Multiple Live Births          1    # Outcome Date GA Lbr Len/2nd Weight Sex Delivery Anes PTL Lv  1 Term     F Vag-Spont   LIV    Obstetric Comments  1 adopted    Patient's last menstrual period was 12/31/1999.  Age at menarche: 61 Age at menopause: Unsure, no menopausal symptoms Hx of HRT: Denies Hx of STDs: Denies Last pap: 08/2018 - NIML, HR HPV negative History of abnormal pap smears: Denies  SCREENING STUDIES:  Last mammogram: 04/2020  Last colonoscopy: within last 10  years Last bone mineral density: 01/2019  MEDICATIONS: Outpatient Encounter Medications as of 04/03/2021  Medication Sig   BIOTIN PO Take 5,000 mcg by mouth daily.   Calcium-Vitamin D-Vitamin K (VIACTIV PO) Take by mouth as needed.   fluticasone (FLONASE) 50 MCG/ACT nasal spray Place into both nostrils daily.   ibuprofen (ADVIL) 800 MG tablet Take 1 tablet (800 mg total) by mouth every 8 (eight) hours as needed for moderate pain. For AFTER surgery   MELATONIN PO Take 5 mg by mouth at bedtime.   senna-docusate (SENOKOT-S) 8.6-50 MG tablet Take 2 tablets by mouth at bedtime. For AFTER surgery, do not take if having loose stools/diarrhea   traMADol (ULTRAM) 50 MG tablet Take 1 tablet (50 mg total) by mouth every 6 (six) hours as needed for severe pain. For AFTER surgery only, do not take and drive   VITAMIN D PO Take 1,000 Int'l Units by mouth daily.   [DISCONTINUED] ibuprofen (ADVIL) 800 MG tablet Take 1 tablet (800 mg total) by mouth every 8 (eight) hours as needed.   No facility-administered encounter medications on file as of 04/03/2021.    ALLERGIES:  Allergies  Allergen Reactions   Anectine [Succinylcholine Chloride]    Amoxicillin-Pot Clavulanate Rash     FAMILY HISTORY:  Family History  Problem Relation Age of  Onset   Hypertension Mother    Heart attack Mother        pacemaker   Heart disease Father    Stroke Father    Cancer Father        prostate   Cancer Maternal Grandfather        liver   Colon cancer Neg Hx    Breast cancer Neg Hx    Ovarian cancer Neg Hx    Endometrial cancer Neg Hx    Pancreatic cancer Neg Hx      SOCIAL HISTORY:  Social Connections: Not on file    REVIEW OF SYSTEMS:  Denies appetite changes, fevers, chills, fatigue, unexplained weight changes. Denies hearing loss, neck lumps or masses, mouth sores, ringing in ears or voice changes. Denies cough or wheezing.  Denies shortness of breath. Denies chest pain or palpitations. Denies leg  swelling. Denies abdominal distention, pain, blood in stools, constipation, diarrhea, nausea, vomiting, or early satiety. Denies pain with intercourse, dysuria, frequency, hematuria or incontinence. Denies hot flashes, pelvic pain, vaginal bleeding or vaginal discharge.   Denies joint pain, back pain or muscle pain/cramps. Denies itching, rash, or wounds. Denies dizziness, headaches, numbness or seizures. Denies swollen lymph nodes or glands, denies easy bruising or bleeding. Denies anxiety, depression, confusion, or decreased concentration.  Physical Exam:  Vital Signs for this encounter:  Blood pressure 124/72, pulse 75, temperature 98 F (36.7 C), temperature source Oral, resp. rate 18, weight 150 lb (68 kg), last menstrual period 12/31/1999, SpO2 100 %. Body mass index is 23.15 kg/m. General: Alert, oriented, no acute distress.  HEENT: Normocephalic, atraumatic. Sclera anicteric.  Chest: Clear to auscultation bilaterally. No wheezes, rhonchi, or rales. Cardiovascular: Regular rate and rhythm, no murmurs, rubs, or gallops.  Abdomen: Normoactive bowel sounds. Soft, nondistended, nontender to palpation. No masses or hepatosplenomegaly appreciated. No palpable fluid wave.  Extremities: Grossly normal range of motion. Warm, well perfused. No edema bilaterally.  Skin: No rashes or lesions.  Lymphatics: No cervical, supraclavicular, or inguinal adenopathy.  GU:  Normal external female genitalia. No lesions. No discharge or bleeding.             Bladder/urethra:  No lesions or masses, well supported bladder             Vagina: Mildly atrophic, no lesions or masses.             Cervix: Normal appearing, no lesions.             Uterus: Surgically absent             Adnexa: Smooth mass, approximately 8 cm, smooth, feels somewhat fixed to the cervix.  Rectal: No nodularity, rectum feels free.  LABORATORY AND RADIOLOGIC DATA:  Outside medical records were reviewed to synthesize the above  history, along with the history and physical obtained during the visit.   CA-125: 54  Lab Results  Component Value Date   WBC 7.4 03/14/2021   HGB 14.4 03/14/2021   HCT 42.8 03/14/2021   PLT 300.0 03/14/2021   GLUCOSE 71 03/14/2021   CHOL 193 06/19/2016   TRIG 89 06/19/2016   HDL 58 06/19/2016   LDLCALC 117 (H) 06/19/2016   ALT 16 03/14/2021   AST 16 03/14/2021   NA 140 03/14/2021   K 3.8 03/14/2021   CL 100 03/14/2021   CREATININE 0.76 03/14/2021   BUN 13 03/14/2021   CO2 31 03/14/2021   TSH 1.29 06/19/2016   Pelvic ultrasound 9/26: IMPRESSION: Complex cystic  adnexal mass potentially of LEFT ovarian origin, 8.9 cm greatest diameter. Internal vascularity within the mural nodule by color Doppler imaging was not established. Either surgical evaluation or potentially further characterization by MR imaging recommended.  CT A/P on 9/21:  IMPRESSION: 1. Cystic mass measuring 8.8 cm within the pelvic cul-de-sac. Findings are favored as ovarian in origin, but indeterminate. Recommend further evaluation with pelvic ultrasound. 2. No evidence for urinary tract calculus or hydronephrosis.

## 2021-04-03 NOTE — Telephone Encounter (Signed)
Patient scheduled on 04/03/21 with Dr.Tucker.

## 2021-04-03 NOTE — Progress Notes (Signed)
Patient here with her husband for a new patient consultation with Dr. Jeral Pinch and for a pre-operative discussion prior to her scheduled surgery on April 20, 2021.  If the 13th of October becomes available, she will be contacted and her surgery will be moved up. She is scheduled for robotic assisted laparoscopic bilateral salpingo-oophorectomy, possible trachelectomy, possible laparotomy, possible staging if a cancer is identified. The surgery was discussed in detail.  See after visit summary for additional details. Visual aids used to discuss items related to surgery including sequential compression stockings, foley catheter, IV pump, multi-modal pain regimen including tylenol, photo of the surgical robot, female reproductive system to discuss surgery in detail.      Discussed post-op pain management in detail including the aspects of the enhanced recovery pathway.  Advised her that a new prescription would be sent in for tramadol and it is only to be used for after her upcoming surgery.  We discussed the use of tylenol post-op and to monitor for a maximum of 4,000 mg in a 24 hour period.  Also prescribed sennakot to be used after surgery and to hold if having loose stools.  Discussed bowel regimen in detail.     Discussed the use of SCDs and measures to take at home to prevent DVT including frequent mobility.  Reportable signs and symptoms of DVT discussed. Post-operative instructions discussed and expectations for after surgery. Incisional care discussed as well including reportable signs and symptoms including erythema, drainage, wound separation.     10 minutes spent with the patient.  Verbalizing understanding of material discussed. No needs or concerns voiced at the end of the visit.   Advised patient and family to call for any needs.  Advised that her post-operative medications had been prescribed and could be picked up at any time.     This appointment is included in the global surgical  bundle as pre-operative teaching and has no charge.

## 2021-04-03 NOTE — Patient Instructions (Signed)
Preparing for your Surgery   Plan for surgery on April 20, 2021 (if the 13th becomes available, we will contact you and move your surgery to this date) with Dr. Jeral Pinch at [place] will be scheduled for robotic assisted laparoscopic bilateral salpingo-oophorectomy (removal of both ovaries and fallopian tubes), possible trachelectomy (removal of the cervix), possible laparotomy (larger incision on your abdomen if needed), possible staging if a cancer is identified including possible biopsies, lymph node removal, omentectomy.     If a cancer is identified, you will be discharged home with blood thinner injections called lovenox. If the surgery is performed laparoscopically, you would need to give yourself once daily injections for a total of 2 weeks. If your surgery is performed through an open incision, it would be for 4 weeks after surgery.   Pre-operative Testing -You will receive a phone call from presurgical testing at [facility] to arrange for a pre-operative appointment and lab work.   -Bring your insurance card, copy of an advanced directive if applicable, medication list   -At that visit, you will be asked to sign a consent for a possible blood transfusion in case a transfusion becomes necessary during surgery.  The need for a blood transfusion is rare but having consent is a necessary part of your care.      -You should not be taking blood thinners or aspirin at least ten days prior to surgery unless instructed by your surgeon.   -Do not take supplements such as fish oil (omega 3), red yeast rice, turmeric before your surgery. You want to avoid medications with aspirin in them including headache powders such as BC or Goody's), Excedrin migraine.   Day Before Surgery at [place] will be asked to take in a light diet the day before surgery. You will be advised you can have clear liquids up until 3 hours before your surgery.     Eat a light diet the day  before surgery.  Examples including soups, broths, toast, yogurt, mashed potatoes.  AVOID GAS PRODUCING FOODS. Things to avoid include carbonated beverages (fizzy beverages, sodas), raw fruits and raw vegetables (uncooked), or beans.    If your bowels are filled with gas, your surgeon will have difficulty visualizing your pelvic organs which increases your surgical risks.   Your role in recovery You should be able to go home the same day of surgery.   Your role is to become active as soon as directed by your doctor, while still giving yourself time to heal.  Rest when you feel tired. You will be asked to do the following in order to speed your recovery:   - Cough and breathe deeply. This helps to clear and expand your lungs and can prevent pneumonia after surgery.  - Terril. Do mild physical activity. Walking or moving your legs help your circulation and body functions return to normal. Do not try to get up or walk alone the first time after surgery.   -If you develop swelling on one leg or the other, pain in the back of your leg, redness/warmth in one of your legs, please call the office or go to the Emergency Room to have a doppler to rule out a blood clot. For shortness of breath, chest pain-seek care in the Emergency Room as soon as possible. - Actively manage your pain. Managing your pain lets you move in comfort. We will ask you to rate your pain on a  scale of zero to 10. It is your responsibility to tell your doctor or nurse where and how much you hurt so your pain can be treated.   Special Considerations -If you are diabetic, you may be placed on insulin after surgery to have closer control over your blood sugars to promote healing and recovery.  This does not mean that you will be discharged on insulin.  If applicable, your oral antidiabetics will be resumed when you are tolerating a solid diet.   -Your final pathology results from surgery should be available around  one week after surgery and the results will be relayed to you when available.   -Dr. Lahoma Crocker is the surgeon that assists your GYN Oncologist with surgery.  If you end up staying the night, the next day after your surgery you will either see Dr. Denman George, Dr. Berline Lopes, or Dr. Lahoma Crocker.   -FMLA forms can be faxed to [phone] and please allow 5-7 business days for completion.   Pain Management After Surgery -You have been prescribed your pain medication and bowel regimen medications before surgery so that you can have these available when you are discharged from the hospital. The pain medication is for use ONLY AFTER surgery and a new prescription will not be given.    -Make sure that you have Tylenol and Ibuprofen at home to use on a regular basis after surgery for pain control. We recommend alternating the medications every hour to six hours since they work differently and are processed in the body differently for pain relief.   -Review the attached handout on narcotic use and their risks and side effects.    Bowel Regimen -You have been prescribed Sennakot-S to take nightly to prevent constipation especially if you are taking the narcotic pain medication intermittently.  It is important to prevent constipation and drink adequate amounts of liquids. You can stop taking this medication when you are not taking pain medication and you are back on your normal bowel routine.   Risks of Surgery Risks of surgery are low but include bleeding, infection, damage to surrounding structures, re-operation, blood clots, and very rarely death.     Blood Transfusion Information (For the consent to be signed before surgery)   We will be checking your blood type before surgery so in case of emergencies, we will know what type of blood you would need.                                             WHAT IS A BLOOD TRANSFUSION?   A transfusion is the replacement of blood or some of its parts.  Blood is made up of multiple cells which provide different functions. Red blood cells carry oxygen and are used for blood loss replacement. White blood cells fight against infection. Platelets control bleeding. Plasma helps clot blood. Other blood products are available for specialized needs, such as hemophilia or other clotting disorders. BEFORE THE TRANSFUSION  Who gives blood for transfusions?  You may be able to donate blood to be used at a later date on yourself (autologous donation). Relatives can be asked to donate blood. This is generally not any safer than if you have received blood from a stranger. The same precautions are taken to ensure safety when a relative's blood is donated. Healthy volunteers who are fully evaluated to make sure their blood is  safe. This is blood bank blood. Transfusion therapy is the safest it has ever been in the practice of medicine. Before blood is taken from a donor, a complete history is taken to make sure that person has no history of diseases nor engages in risky social behavior (examples are intravenous drug use or sexual activity with multiple partners). The donor's travel history is screened to minimize risk of transmitting infections, such as malaria. The donated blood is tested for signs of infectious diseases, such as HIV and hepatitis. The blood is then tested to be sure it is compatible with you in order to minimize the chance of a transfusion reaction. If you or a relative donates blood, this is often done in anticipation of surgery and is not appropriate for emergency situations. It takes many days to process the donated blood. RISKS AND COMPLICATIONS Although transfusion therapy is very safe and saves many lives, the main dangers of transfusion include:  Getting an infectious disease. Developing a transfusion reaction. This is an allergic reaction to something in the blood you were given. Every precaution is taken to prevent this. The decision to  have a blood transfusion has been considered carefully by your caregiver before blood is given. Blood is not given unless the benefits outweigh the risks.   AFTER SURGERY INSTRUCTIONS   Return to work: 4-6 weeks if applicable   Activity: 1. Be up and out of the bed during the day.  Take a nap if needed.  You may walk up steps but be careful and use the hand rail.  Stair climbing will tire you more than you think, you may need to stop part way and rest.    2. No lifting or straining for 6 weeks over 10 pounds. No pushing, pulling, straining for 6 weeks.   3. No driving for 1 week(s).  Do not drive if you are taking narcotic pain medicine and make sure that your reaction time has returned.    4. You can shower as soon as the next day after surgery. Shower daily.  Use your regular soap and water (not directly on the incision) and pat your incision(s) dry afterwards; don't rub.  No tub baths or submerging your body in water until cleared by your surgeon. If you have the soap that was given to you by pre-surgical testing that was used before surgery, you do not need to use it afterwards because this can irritate your incisions.    5. No sexual activity and nothing in the vagina for 4 weeks. IF A TRACHELECTOMY IS PERFORMED, NOTHING IN THE VAGINA FOR 8 WEEKS.   6. You may experience a small amount of clear drainage from your incisions, which is normal.  If the drainage persists, increases, or changes color please call the office.   7. Do not use creams, lotions, or ointments such as neosporin on your incisions after surgery until advised by your surgeon because they can cause removal of the dermabond glue on your incisions.     8. Take Tylenol or ibuprofen first for pain and only use narcotic pain medication for severe pain not relieved by the Tylenol or Ibuprofen.  Monitor your Tylenol intake to a max of 4,000 mg in a 24 hour period. You can alternate these medications after surgery.   Diet: 1. Low  sodium Heart Healthy Diet is recommended but you are cleared to resume your normal (before surgery) diet after your procedure.   2. It is safe to use a laxative,  such as Miralax or Colace, if you have difficulty moving your bowels. You have been prescribed Sennakot at bedtime every evening to keep bowel movements regular and to prevent constipation.     Wound Care: 1. Keep clean and dry.  Shower daily.   Reasons to call the Doctor: Fever - Oral temperature greater than 100.4 degrees Fahrenheit Foul-smelling vaginal discharge Difficulty urinating Nausea and vomiting Increased pain at the site of the incision that is unrelieved with pain medicine. Difficulty breathing with or without chest pain New calf pain especially if only on one side Sudden, continuing increased vaginal bleeding with or without clots.   Contacts: For questions or concerns you should contact:   Dr. Jeral Pinch at [phone]   Joylene John, NP at [phone]   After Hours: call [phone] and have the GYN Oncologist paged/contacted (after 5 pm or on the weekends).   Messages sent via mychart are for non-urgent matters and are not responded to after hours so for urgent needs, please call the after hours number.

## 2021-04-03 NOTE — H&P (View-Only) (Signed)
GYNECOLOGIC ONCOLOGY NEW PATIENT CONSULTATION   Patient Name: Katherine Kerr  Patient Age: 65 y.o. Date of Service: 04/03/21 Referring Provider: Dr. Sumner Boast  Primary Care Provider: Billie Ruddy, MD Consulting Provider: Jeral Pinch, MD   Assessment/Plan:  Postmenopausal patient with history of supracervical hysterectomy and questionable endometriosis now with a complex adnexal mass.   Discussed findings on my exam as well as imaging, which show a complex mass without obvious evidence of metastatic disease. While CA-125 is mildly elevated, I reviewed that this is not a test that is approved for diagnosis. Once the decision for surgery has been made, this can help determine if gyn versus gyn oncologist should be performing the surgery. In up to 50% of patients with early ovarian cancer, this lab test can be normal. It can also be elevated in many benign disease processes such as endometriosis.   For both diagnostic and therapeutic purposes, I recommend that we proceed with surgery excision. Plan will be for robotic BSO with removal of adnexal mass, placement in a bag for contained drainage and sending it for frozen section. If mass benign, then only tubes and ovaries will be removed. If mass represents a borderline tumor, we discussed completion surgery with BSO. While completion surgery recommendation include TLH, there have been no reports of recurrence within the uterus. I do not feel that there is a strong indication for trachelectomy in this setting. If malignancy is suspected on frozen section, in addition to BSO, we discussed staging, which may include lymph node sampling and omentectomy, as well as mini-laparotomy for palpation of intra-abdominal and pelvic surfaces.   We discussed plan for a robotic assisted bilateral salpingo-oophorectomy, possible staging including lymph node dissection and omentectomy, possible trachelectomy, possible laparotomy. The risks of surgery were  discussed in detail and she understands these to include infection; wound separation; hernia; vaginal cuff separation, injury to adjacent organs such as bowel, bladder, blood vessels, ureters and nerves; bleeding which may require blood transfusion; anesthesia risk; thromboembolic events; possible death; unforeseen complications; possible need for re-exploration; medical complications such as heart attack, stroke, pleural effusion and pneumonia; and, if full lymphadenectomy is performed the risk of lymphedema and lymphocyst. The patient will receive DVT and antibiotic prophylaxis as indicated. She voiced a clear understanding. She had the opportunity to ask questions. Perioperative instructions were reviewed with her. Prescriptions for post-op medications were sent to her pharmacy of choice.  A copy of this note was sent to the patient's referring provider.   85 minutes of total time was spent for this patient encounter, including preparation, face-to-face counseling with the patient and coordination of care, and documentation of the encounter.   Jeral Pinch, MD  Division of Gynecologic Oncology  Department of Obstetrics and Gynecology  [facility]  ___________________________________________  Chief Complaint: Chief Complaint  Patient presents with   Pelvic mass    History of Present Illness:  Katherine Kerr is a 65 y.o. y.o. female who is seen in consultation at the request of Dr. Talbert Nan for an evaluation of a complex adnexal mass.  The patient reports several weeks of right flank pain that she describes as a "pulling" sensation.  This seems to occur more when she is active, sitting or laying still helps ease the pain.  Describes the pain as sporadic and was intense for the first several days.  At times, pain is sharp.  She has tried Advil that helps relieve the pain somewhat.  She also has  used a heating pad with some improvement.  She endorses a normal  appetite without abdominal bloating or early satiety.  She notes some nausea initially with the pain, denies any more recently and denies any emesis.  She has felt some abdominal "fullness".  She denies any bowel or bladder symptoms.  Patient lives in [place] with her husband.  She reports some social alcohol use, denies any tobacco use.  She is not currently working.  Patient surgical history is notable for a laparoscopic supracervical hysterectomy approximately 25 years ago.  She also underwent some sort of procedure for infertility (she is describes this today as an endometrial ablation but it sounds like it may have been hysteroscopic resection of something).  Given infertility, she underwent fertility treatment and was not able to achieve pregnancy.  Shortly after, she ultimately was able to become pregnant.  She carries a chart diagnosis of endometriosis.  PAST MEDICAL HISTORY:  Past Medical History:  Diagnosis Date   Endometriosis    Fibroid    Osteopenia 07-07-13   spine only -1.2   Scabies      PAST SURGICAL HISTORY:  Past Surgical History:  Procedure Laterality Date   EYE SURGERY     laser procedure in office   hysteroscopic resection  10/1998   SUPRACERVICAL ABDOMINAL HYSTERECTOMY  2001   Supracervical laparoscopic hysterectomy, retained ovaries   VARICOSE VEIN SURGERY  2011,2012,2013    OB/GYN HISTORY:  OB History  Gravida Para Term Preterm AB Living  1 1 1     1   SAB IAB Ectopic Multiple Live Births          1    # Outcome Date GA Lbr Len/2nd Weight Sex Delivery Anes PTL Lv  1 Term     F Vag-Spont   LIV    Obstetric Comments  1 adopted    Patient's last menstrual period was 12/31/1999.  Age at menarche: 36 Age at menopause: Unsure, no menopausal symptoms Hx of HRT: Denies Hx of STDs: Denies Last pap: 08/2018 - NIML, HR HPV negative History of abnormal pap smears: Denies  SCREENING STUDIES:  Last mammogram: 04/2020  Last colonoscopy: within last 10  years Last bone mineral density: 01/2019  MEDICATIONS: Outpatient Encounter Medications as of 04/03/2021  Medication Sig   BIOTIN PO Take 5,000 mcg by mouth daily.   Calcium-Vitamin D-Vitamin K (VIACTIV PO) Take by mouth as needed.   fluticasone (FLONASE) 50 MCG/ACT nasal spray Place into both nostrils daily.   ibuprofen (ADVIL) 800 MG tablet Take 1 tablet (800 mg total) by mouth every 8 (eight) hours as needed for moderate pain. For AFTER surgery   MELATONIN PO Take 5 mg by mouth at bedtime.   senna-docusate (SENOKOT-S) 8.6-50 MG tablet Take 2 tablets by mouth at bedtime. For AFTER surgery, do not take if having loose stools/diarrhea   traMADol (ULTRAM) 50 MG tablet Take 1 tablet (50 mg total) by mouth every 6 (six) hours as needed for severe pain. For AFTER surgery only, do not take and drive   VITAMIN D PO Take 1,000 Int'l Units by mouth daily.   [DISCONTINUED] ibuprofen (ADVIL) 800 MG tablet Take 1 tablet (800 mg total) by mouth every 8 (eight) hours as needed.   No facility-administered encounter medications on file as of 04/03/2021.    ALLERGIES:  Allergies  Allergen Reactions   Anectine [Succinylcholine Chloride]    Amoxicillin-Pot Clavulanate Rash     FAMILY HISTORY:  Family History  Problem Relation Age of  Onset   Hypertension Mother    Heart attack Mother        pacemaker   Heart disease Father    Stroke Father    Cancer Father        prostate   Cancer Maternal Grandfather        liver   Colon cancer Neg Hx    Breast cancer Neg Hx    Ovarian cancer Neg Hx    Endometrial cancer Neg Hx    Pancreatic cancer Neg Hx      SOCIAL HISTORY:  Social Connections: Not on file    REVIEW OF SYSTEMS:  Denies appetite changes, fevers, chills, fatigue, unexplained weight changes. Denies hearing loss, neck lumps or masses, mouth sores, ringing in ears or voice changes. Denies cough or wheezing.  Denies shortness of breath. Denies chest pain or palpitations. Denies leg  swelling. Denies abdominal distention, pain, blood in stools, constipation, diarrhea, nausea, vomiting, or early satiety. Denies pain with intercourse, dysuria, frequency, hematuria or incontinence. Denies hot flashes, pelvic pain, vaginal bleeding or vaginal discharge.   Denies joint pain, back pain or muscle pain/cramps. Denies itching, rash, or wounds. Denies dizziness, headaches, numbness or seizures. Denies swollen lymph nodes or glands, denies easy bruising or bleeding. Denies anxiety, depression, confusion, or decreased concentration.  Physical Exam:  Vital Signs for this encounter:  Blood pressure 124/72, pulse 75, temperature 98 F (36.7 C), temperature source Oral, resp. rate 18, weight 150 lb (68 kg), last menstrual period 12/31/1999, SpO2 100 %. Body mass index is 23.15 kg/m. General: Alert, oriented, no acute distress.  HEENT: Normocephalic, atraumatic. Sclera anicteric.  Chest: Clear to auscultation bilaterally. No wheezes, rhonchi, or rales. Cardiovascular: Regular rate and rhythm, no murmurs, rubs, or gallops.  Abdomen: Normoactive bowel sounds. Soft, nondistended, nontender to palpation. No masses or hepatosplenomegaly appreciated. No palpable fluid wave.  Extremities: Grossly normal range of motion. Warm, well perfused. No edema bilaterally.  Skin: No rashes or lesions.  Lymphatics: No cervical, supraclavicular, or inguinal adenopathy.  GU:  Normal external female genitalia. No lesions. No discharge or bleeding.             Bladder/urethra:  No lesions or masses, well supported bladder             Vagina: Mildly atrophic, no lesions or masses.             Cervix: Normal appearing, no lesions.             Uterus: Surgically absent             Adnexa: Smooth mass, approximately 8 cm, smooth, feels somewhat fixed to the cervix.  Rectal: No nodularity, rectum feels free.  LABORATORY AND RADIOLOGIC DATA:  Outside medical records were reviewed to synthesize the above  history, along with the history and physical obtained during the visit.   CA-125: 54  Lab Results  Component Value Date   WBC 7.4 03/14/2021   HGB 14.4 03/14/2021   HCT 42.8 03/14/2021   PLT 300.0 03/14/2021   GLUCOSE 71 03/14/2021   CHOL 193 06/19/2016   TRIG 89 06/19/2016   HDL 58 06/19/2016   LDLCALC 117 (H) 06/19/2016   ALT 16 03/14/2021   AST 16 03/14/2021   NA 140 03/14/2021   K 3.8 03/14/2021   CL 100 03/14/2021   CREATININE 0.76 03/14/2021   BUN 13 03/14/2021   CO2 31 03/14/2021   TSH 1.29 06/19/2016   Pelvic ultrasound 9/26: IMPRESSION: Complex cystic  adnexal mass potentially of LEFT ovarian origin, 8.9 cm greatest diameter. Internal vascularity within the mural nodule by color Doppler imaging was not established. Either surgical evaluation or potentially further characterization by MR imaging recommended.  CT A/P on 9/21:  IMPRESSION: 1. Cystic mass measuring 8.8 cm within the pelvic cul-de-sac. Findings are favored as ovarian in origin, but indeterminate. Recommend further evaluation with pelvic ultrasound. 2. No evidence for urinary tract calculus or hydronephrosis.

## 2021-04-04 ENCOUNTER — Ambulatory Visit: Payer: BC Managed Care – PPO | Admitting: Obstetrics and Gynecology

## 2021-04-04 NOTE — Telephone Encounter (Signed)
Patient saw her gyn.

## 2021-04-05 ENCOUNTER — Telehealth: Payer: Self-pay

## 2021-04-05 ENCOUNTER — Encounter (HOSPITAL_COMMUNITY): Payer: Self-pay | Admitting: Gynecologic Oncology

## 2021-04-05 ENCOUNTER — Other Ambulatory Visit: Payer: Self-pay

## 2021-04-05 NOTE — Progress Notes (Addendum)
For Short Stay: COVID SWAB appointment date: N/A Date of COVID positive in last 90 days: No   For Anesthesia: PCP - Billie Ruddy, MD Cardiologist - N/A  Chest x-ray - N/A EKG - N/A Stress Test - N/A ECHO - N/A Cardiac Cath - N/A Pacemaker/ICD device last checked: N/A  Sleep Study - N/A CPAP - N/A  Fasting Blood Sugar - N/A Checks Blood Sugar __N/A___ times a day  Blood Thinner Instructions: N/A Aspirin Instructions: N/A Last Dose: N/A  Activity level: Able to exercise without chest pain and/or shortness of breath       Anesthesia review: N/A  Patient denies shortness of breath, fever, cough and chest pain at PAT appointment   Patient verbalized understanding of instructions that were given to them at the PAT appointment. Patient was also instructed that they will need to review over the PAT instructions again at home before surgery.

## 2021-04-05 NOTE — Telephone Encounter (Signed)
Spoke with patient. She states "I took ibuprofen on Sunday and I know I'm supposed to avoid it ten days prior to surgery. Is this ok?" Per Joylene John, NP ok to proceed with surgery as long as patient is not taking large doses and is continuing to take. Patient states " I took a small dose, not prescription strength and it was just the once on Sunday afternoon." Informed patient that per our prior auth department we are ok to proceed with surgery tomorrow from an insurance standpoint. Patient has spoken with her family and would like to proceed with surgery tomorrow. Informed patient that RN will contact her with what time she is to arrive to hospital tomorrow. Patient verbalized understanding.

## 2021-04-05 NOTE — Telephone Encounter (Signed)
Spoke with Florentina Jenny re: surgery scheduling. Inquiring if patient would be interested in moving her surgery to tomorrow 10/6 if her insurance authorization came back in time. Patient is interested pending insurance approval. She is going to call our office back once she has spoken with her family. Provided office number of [phone].

## 2021-04-06 ENCOUNTER — Encounter (HOSPITAL_COMMUNITY): Payer: Self-pay | Admitting: Gynecologic Oncology

## 2021-04-06 ENCOUNTER — Ambulatory Visit (HOSPITAL_COMMUNITY): Payer: Medicare Other | Admitting: Certified Registered Nurse Anesthetist

## 2021-04-06 ENCOUNTER — Telehealth: Payer: Self-pay

## 2021-04-06 ENCOUNTER — Encounter (HOSPITAL_COMMUNITY)
Admission: RE | Disposition: A | Discharge status: Home / Self Care | Payer: Self-pay | Source: Home / Self Care | Attending: Gynecologic Oncology

## 2021-04-06 ENCOUNTER — Ambulatory Visit (HOSPITAL_COMMUNITY)
Admission: RE | Admit: 2021-04-06 | Discharge: 2021-04-06 | Disposition: A | Discharge status: Home / Self Care | Payer: Medicare Other | Attending: Gynecologic Oncology | Admitting: Gynecologic Oncology

## 2021-04-06 DIAGNOSIS — Z880 Allergy status to penicillin: Secondary | ICD-10-CM | POA: Insufficient documentation

## 2021-04-06 DIAGNOSIS — Z79899 Other long term (current) drug therapy: Secondary | ICD-10-CM | POA: Diagnosis not present

## 2021-04-06 DIAGNOSIS — Z791 Long term (current) use of non-steroidal anti-inflammatories (NSAID): Secondary | ICD-10-CM | POA: Diagnosis not present

## 2021-04-06 DIAGNOSIS — R978 Other abnormal tumor markers: Secondary | ICD-10-CM

## 2021-04-06 DIAGNOSIS — R971 Elevated cancer antigen 125 [CA 125]: Secondary | ICD-10-CM | POA: Diagnosis not present

## 2021-04-06 DIAGNOSIS — R1900 Intra-abdominal and pelvic swelling, mass and lump, unspecified site: Secondary | ICD-10-CM | POA: Diagnosis present

## 2021-04-06 DIAGNOSIS — Z87442 Personal history of urinary calculi: Secondary | ICD-10-CM | POA: Diagnosis not present

## 2021-04-06 DIAGNOSIS — D270 Benign neoplasm of right ovary: Secondary | ICD-10-CM

## 2021-04-06 DIAGNOSIS — N809 Endometriosis, unspecified: Secondary | ICD-10-CM | POA: Insufficient documentation

## 2021-04-06 HISTORY — PX: ROBOTIC ASSISTED BILATERAL SALPINGO OOPHERECTOMY: SHX6078

## 2021-04-06 HISTORY — DX: Personal history of urinary calculi: Z87.442

## 2021-04-06 HISTORY — DX: Anemia, unspecified: D64.9

## 2021-04-06 LAB — TYPE AND SCREEN
ABO/RH(D): A POS
Antibody Screen: NEGATIVE

## 2021-04-06 LAB — ABO/RH: ABO/RH(D): A POS

## 2021-04-06 SURGERY — SALPINGO-OOPHORECTOMY, BILATERAL, ROBOT-ASSISTED
Anesthesia: General | Site: Abdomen

## 2021-04-06 MED ORDER — DEXAMETHASONE SODIUM PHOSPHATE 4 MG/ML IJ SOLN: 4.0000 mg | INTRAMUSCULAR | Status: DC

## 2021-04-06 MED ORDER — EPHEDRINE SULFATE-NACL 50-0.9 MG/10ML-% IV SOSY
PREFILLED_SYRINGE | INTRAVENOUS | Status: DC | PRN
  Administered 2021-04-06 (×2): 10 mg via INTRAVENOUS

## 2021-04-06 MED ORDER — HEPARIN SODIUM (PORCINE) 5000 UNIT/ML IJ SOLN
5000.0000 [IU] | INTRAMUSCULAR | Status: AC
  Administered 2021-04-06: 5000 [IU] via SUBCUTANEOUS
  Filled 2021-04-06: qty 1

## 2021-04-06 MED ORDER — DEXAMETHASONE SODIUM PHOSPHATE 10 MG/ML IJ SOLN
INTRAMUSCULAR | Status: AC
  Filled 2021-04-06: qty 1

## 2021-04-06 MED ORDER — ROCURONIUM BROMIDE 10 MG/ML (PF) SYRINGE
PREFILLED_SYRINGE | INTRAVENOUS | Status: DC | PRN
  Administered 2021-04-06: 10 mg via INTRAVENOUS
  Administered 2021-04-06: 70 mg via INTRAVENOUS

## 2021-04-06 MED ORDER — BUPIVACAINE HCL 0.25 % IJ SOLN
INTRAMUSCULAR | Status: DC | PRN
  Administered 2021-04-06: 31 mL

## 2021-04-06 MED ORDER — ONDANSETRON HCL 4 MG/2ML IJ SOLN
INTRAMUSCULAR | Status: AC
  Filled 2021-04-06: qty 2

## 2021-04-06 MED ORDER — FENTANYL CITRATE (PF) 100 MCG/2ML IJ SOLN
INTRAMUSCULAR | Status: AC
  Filled 2021-04-06: qty 2

## 2021-04-06 MED ORDER — CHLORHEXIDINE GLUCONATE 0.12 % MT SOLN
15.0000 mL | Freq: Once | OROMUCOSAL | Status: AC
  Administered 2021-04-06: 15 mL via OROMUCOSAL

## 2021-04-06 MED ORDER — ORAL CARE MOUTH RINSE: 15.0000 mL | Freq: Once | OROMUCOSAL | Status: AC

## 2021-04-06 MED ORDER — SUGAMMADEX SODIUM 200 MG/2ML IV SOLN
INTRAVENOUS | Status: DC | PRN
Start: 2021-04-06 — End: 2021-04-06
  Administered 2021-04-06: 120 mg via INTRAVENOUS

## 2021-04-06 MED ORDER — HYDROMORPHONE HCL 1 MG/ML IJ SOLN
INTRAMUSCULAR | Status: AC
  Filled 2021-04-06: qty 1

## 2021-04-06 MED ORDER — ACETAMINOPHEN 500 MG PO TABS
1000.0000 mg | ORAL_TABLET | ORAL | Status: AC
  Administered 2021-04-06: 1000 mg via ORAL
  Filled 2021-04-06: qty 2

## 2021-04-06 MED ORDER — ONDANSETRON HCL 4 MG/2ML IJ SOLN
INTRAMUSCULAR | Status: DC | PRN
  Administered 2021-04-06: 4 mg via INTRAVENOUS

## 2021-04-06 MED ORDER — BUPIVACAINE HCL 0.25 % IJ SOLN
INTRAMUSCULAR | Status: AC
  Filled 2021-04-06: qty 1

## 2021-04-06 MED ORDER — FENTANYL CITRATE (PF) 100 MCG/2ML IJ SOLN
INTRAMUSCULAR | Status: DC | PRN
  Administered 2021-04-06 (×4): 50 ug via INTRAVENOUS

## 2021-04-06 MED ORDER — PROPOFOL 10 MG/ML IV BOLUS
INTRAVENOUS | Status: AC
  Filled 2021-04-06: qty 20

## 2021-04-06 MED ORDER — PROPOFOL 10 MG/ML IV BOLUS
INTRAVENOUS | Status: DC | PRN
  Administered 2021-04-06: 120 mg via INTRAVENOUS

## 2021-04-06 MED ORDER — ONDANSETRON HCL 4 MG/2ML IJ SOLN: 4.0000 mg | Freq: Once | INTRAMUSCULAR | Status: DC | PRN

## 2021-04-06 MED ORDER — LACTATED RINGERS IV SOLN: INTRAVENOUS | Status: DC

## 2021-04-06 MED ORDER — MIDAZOLAM HCL 5 MG/5ML IJ SOLN
INTRAMUSCULAR | Status: DC | PRN
  Administered 2021-04-06: 2 mg via INTRAVENOUS

## 2021-04-06 MED ORDER — STERILE WATER FOR IRRIGATION IR SOLN
Status: DC | PRN
  Administered 2021-04-06: 1000 mL

## 2021-04-06 MED ORDER — LIDOCAINE 2% (20 MG/ML) 5 ML SYRINGE
INTRAMUSCULAR | Status: DC | PRN
  Administered 2021-04-06: 60 mg via INTRAVENOUS

## 2021-04-06 MED ORDER — HYDROMORPHONE HCL 1 MG/ML IJ SOLN
0.2500 mg | INTRAMUSCULAR | Status: DC | PRN
  Administered 2021-04-06 (×2): 0.5 mg via INTRAVENOUS

## 2021-04-06 MED ORDER — LACTATED RINGERS IR SOLN
Status: DC | PRN
  Administered 2021-04-06: 1000 mL

## 2021-04-06 MED ORDER — LIDOCAINE 20MG/ML (2%) 15 ML SYRINGE OPTIME
INTRAMUSCULAR | Status: DC | PRN
  Administered 2021-04-06: 1.5 mg/kg/h via INTRAVENOUS

## 2021-04-06 MED ORDER — DEXAMETHASONE SODIUM PHOSPHATE 10 MG/ML IJ SOLN
INTRAMUSCULAR | Status: DC | PRN
  Administered 2021-04-06: 6 mg via INTRAVENOUS

## 2021-04-06 MED ORDER — MIDAZOLAM HCL 2 MG/2ML IJ SOLN
INTRAMUSCULAR | Status: AC
  Filled 2021-04-06: qty 2

## 2021-04-06 SURGICAL SUPPLY — 66 items
APPLICATOR SURGIFLO ENDO (HEMOSTASIS) IMPLANT
BAG COUNTER SPONGE SURGICOUNT (BAG) IMPLANT
BAG LAPAROSCOPIC 12 15 PORT 16 (BASKET) ×2 IMPLANT
BAG RETRIEVAL 12/15 (BASKET) ×3
BLADE SURG SZ10 CARB STEEL (BLADE) IMPLANT
COVER BACK TABLE 60X90IN (DRAPES) ×3 IMPLANT
COVER TIP SHEARS 8 DVNC (MISCELLANEOUS) ×2 IMPLANT
COVER TIP SHEARS 8MM DA VINCI (MISCELLANEOUS) ×3
DECANTER SPIKE VIAL GLASS SM (MISCELLANEOUS) ×3 IMPLANT
DERMABOND ADVANCED (GAUZE/BANDAGES/DRESSINGS) ×1
DERMABOND ADVANCED .7 DNX12 (GAUZE/BANDAGES/DRESSINGS) ×2 IMPLANT
DRAPE ARM DVNC X/XI (DISPOSABLE) ×8 IMPLANT
DRAPE COLUMN DVNC XI (DISPOSABLE) ×2 IMPLANT
DRAPE DA VINCI XI ARM (DISPOSABLE) ×12
DRAPE DA VINCI XI COLUMN (DISPOSABLE) ×3
DRAPE SHEET LG 3/4 BI-LAMINATE (DRAPES) ×3 IMPLANT
DRAPE SURG IRRIG POUCH 19X23 (DRAPES) ×3 IMPLANT
DRSG OPSITE POSTOP 4X6 (GAUZE/BANDAGES/DRESSINGS) IMPLANT
DRSG OPSITE POSTOP 4X8 (GAUZE/BANDAGES/DRESSINGS) IMPLANT
ELECT PENCIL ROCKER SW 15FT (MISCELLANEOUS) IMPLANT
ELECT REM PT RETURN 15FT ADLT (MISCELLANEOUS) ×3 IMPLANT
GAUZE 4X4 16PLY ~~LOC~~+RFID DBL (SPONGE) ×3 IMPLANT
GLOVE SURG ENC MOIS LTX SZ6 (GLOVE) ×12 IMPLANT
GLOVE SURG ENC MOIS LTX SZ6.5 (GLOVE) ×6 IMPLANT
GOWN STRL REUS W/ TWL LRG LVL3 (GOWN DISPOSABLE) ×8 IMPLANT
GOWN STRL REUS W/TWL LRG LVL3 (GOWN DISPOSABLE) ×12
HOLDER FOLEY CATH W/STRAP (MISCELLANEOUS) IMPLANT
IRRIG SUCT STRYKERFLOW 2 WTIP (MISCELLANEOUS) ×3
IRRIGATION SUCT STRKRFLW 2 WTP (MISCELLANEOUS) ×2 IMPLANT
KIT PROCEDURE DA VINCI SI (MISCELLANEOUS)
KIT PROCEDURE DVNC SI (MISCELLANEOUS) IMPLANT
KIT TURNOVER KIT A (KITS) ×3 IMPLANT
MANIPULATOR ADVINCU DEL 3.0 PL (MISCELLANEOUS) IMPLANT
MANIPULATOR UTERINE 4.5 ZUMI (MISCELLANEOUS) ×3 IMPLANT
NEEDLE HYPO 21X1.5 SAFETY (NEEDLE) ×3 IMPLANT
NEEDLE SPNL 18GX3.5 QUINCKE PK (NEEDLE) IMPLANT
OBTURATOR OPTICAL STANDARD 8MM (TROCAR) ×3
OBTURATOR OPTICAL STND 8 DVNC (TROCAR) ×2
OBTURATOR OPTICALSTD 8 DVNC (TROCAR) ×2 IMPLANT
PACK ROBOT GYN CUSTOM WL (TRAY / TRAY PROCEDURE) ×3 IMPLANT
PAD POSITIONING PINK XL (MISCELLANEOUS) ×3 IMPLANT
PORT ACCESS TROCAR AIRSEAL 12 (TROCAR) ×2 IMPLANT
PORT ACCESS TROCAR AIRSEAL 5M (TROCAR) ×1
POUCH SPECIMEN RETRIEVAL 10MM (ENDOMECHANICALS) ×3 IMPLANT
SCRUB EXIDINE 4% CHG 4OZ (MISCELLANEOUS) ×3 IMPLANT
SEAL CANN UNIV 5-8 DVNC XI (MISCELLANEOUS) ×8 IMPLANT
SEAL XI 5MM-8MM UNIVERSAL (MISCELLANEOUS) ×12
SET TRI-LUMEN FLTR TB AIRSEAL (TUBING) ×3 IMPLANT
SPONGE T-LAP 18X18 ~~LOC~~+RFID (SPONGE) IMPLANT
SURGIFLO W/THROMBIN 8M KIT (HEMOSTASIS) IMPLANT
SUT MNCRL AB 4-0 PS2 18 (SUTURE) ×6 IMPLANT
SUT PDS AB 1 TP1 96 (SUTURE) IMPLANT
SUT VIC AB 0 CT1 27 (SUTURE)
SUT VIC AB 0 CT1 27XBRD ANTBC (SUTURE) IMPLANT
SUT VIC AB 2-0 CT1 27 (SUTURE)
SUT VIC AB 2-0 CT1 TAPERPNT 27 (SUTURE) IMPLANT
SUT VIC AB 4-0 PS2 18 (SUTURE) ×6 IMPLANT
SYR 10ML LL (SYRINGE) IMPLANT
SYR 20ML LL LF (SYRINGE) ×3 IMPLANT
TOWEL OR NON WOVEN STRL DISP B (DISPOSABLE) IMPLANT
TRAP SPECIMEN MUCUS 40CC (MISCELLANEOUS) ×3 IMPLANT
TRAY FOLEY MTR SLVR 16FR STAT (SET/KITS/TRAYS/PACK) ×3 IMPLANT
TROCAR XCEL NON-BLD 5MMX100MML (ENDOMECHANICALS) IMPLANT
UNDERPAD 30X36 HEAVY ABSORB (UNDERPADS AND DIAPERS) ×3 IMPLANT
WATER STERILE IRR 1000ML POUR (IV SOLUTION) ×3 IMPLANT
YANKAUER SUCT BULB TIP 10FT TU (MISCELLANEOUS) IMPLANT

## 2021-04-06 NOTE — Op Note (Signed)
OPERATIVE NOTE  Pre-operative Diagnosis: Complex pelvic mass, elevated CA-125  Post-operative Diagnosis: same, benign serous cyst on frozen section, evidence of endometriosis  Operation: Robotic-assisted laparoscopic bilateral salpingo-oophorectomy  Surgeon: Jeral Pinch MD  Assistant Surgeon: Joylene John NP   Anesthesia: GET  Urine Output: 225cc  Operative Findings: On EUA, cervix appreciated, smooth mass noted in the cul de sac, mobile. On intra-abdominal entry, normal upper abdominal survey. Normal omentum, small and large bowel. Right ovary with 8-10cm cyst, smooth, torsed on IP ligament x2. Left adnexa normal in appearance. No ascites. No adenopathy or other evidence of pelvic disease. Powder burn lesion on cervix c/w endometriosis.   Estimated Blood Loss:  less than 100 mL      Total IV Fluids: see I&O flowsheet         Specimens:  bilateral tubes and ovaries, pelvic washings         Complications:  None apparent; patient tolerated the procedure well.         Disposition: PACU - hemodynamically stable.  Procedure Details  The patient was seen in the Holding Room. The risks, benefits, complications, treatment options, and expected outcomes were discussed with the patient.  The patient concurred with the proposed plan, giving informed consent.  The site of surgery properly noted/marked. The patient was identified as Romero Liner and the procedure verified as a Robotic-assisted bilateral salpingo oophorectomy, possible staging.   After induction of anesthesia, the patient was draped and prepped in the usual sterile manner. Patient was placed in supine position after anesthesia and draped and prepped in the usual sterile manner as follows: Her arms were tucked to her side with all appropriate precautions.  The shoulders were stabilized with padded shoulder blocks applied to the acromium processes.  The patient was placed in the semi-lithotomy position in [place].  The  perineum and vagina were prepped with CholoraPrep. The patient was draped after the CholoraPrep had been allowed to dry for 3 minutes.  A Time Out was held and the above information confirmed.  The urethra was prepped with Betadine. Foley catheter was placed.  OG was placed to decompress the stomach.   Next, a 10 mm skin incision was made 1 cm below the subcostal margin in the midclavicular line.  The 5 mm Optiview port and scope was used for direct entry.  Opening pressure was under 10 mm CO2.  The abdomen was insufflated and the findings were noted as above.   At this point and all points during the procedure, the patient's intra-abdominal pressure did not exceed 15 mmHg. Next, an 8 mm skin incision was made superior to the umbilicus and a right and left port were placed about 8 cm lateral to the robot port on the right and left side.  A fourth arm was placed on the right.  The 5 mm assist trocar was exchanged for a 10-12 mm port. All ports were placed under direct visualization.  The patient was placed in steep Trendelenburg.  Bowel was folded away into the upper abdomen.  The robot was docked in the normal manner.  The right and left peritoneum were opened parallel to the IP ligament to open the retroperitoneal spaces bilaterally. The round ligaments were transected. The ureter was noted to be on the medial leaf of the broad ligament.  The peritoneum above the ureter was incised and stretched and the infundibulopelvic ligament was skeletonized, cauterized and cut.  On the right, the medial leaf of the broad ligament was cauterized  to release the adnexa from more inferior peritoneal attachment. Once freed, the adnexa was placed in a 76mm Endocatch bag. The same was done on the left and the adnexa placed in a 10mm Endocatch bag.   Irrigation was used and excellent hemostasis was achieved.  Robotic instruments were removed under direct visualization. With the abdomen still insufflated, the 19mm bag was  delivered through assist trocar incision. The 38mm bag was then brought through the skin incision and the cyst was decompressed in a contained manner with clear fluid noted. Once decompressed, the adnexa was delivered through the incision and sent to pathology.  Once frozen section returned, the robot was undocked. The abdomen was desufflated. The fascia at the 10-12 mm port was closed with 0 Vicryl on a UR-5 needle.  The subcuticular tissue was closed with 4-0 Vicryl and the skin was closed with 4-0 Monocryl in a subcuticular manner.  Dermabond was applied.    Foley catheter was removed.  All sponge, lap and needle counts were correct x  3.   The patient was transferred to the recovery room in stable condition.  Jeral Pinch, MD

## 2021-04-06 NOTE — Anesthesia Postprocedure Evaluation (Signed)
Anesthesia Post Note  Patient: Katherine Kerr  Procedure(s) Performed: XI ROBOTIC ASSISTED BILATERAL SALPINGO OOPHORECTOMY (Bilateral: Abdomen)     Patient location during evaluation: PACU Anesthesia Type: General Level of consciousness: awake and alert and oriented Pain management: pain level controlled Vital Signs Assessment: post-procedure vital signs reviewed and stable Respiratory status: spontaneous breathing, nonlabored ventilation and respiratory function stable Cardiovascular status: blood pressure returned to baseline and stable Anesthetic complications: no Comments: Had some PON in PACU, given Barhemsys with improvement.   No notable events documented.  Last Vitals:  Vitals:   04/06/21 1200 04/06/21 1215  BP: 127/73 121/70  Pulse: 69 69  Resp: 13 11  Temp:    SpO2: 99% 98%    Last Pain:  Vitals:   04/06/21 1225  TempSrc:   PainSc: 3                  Evo Aderman A.

## 2021-04-06 NOTE — Anesthesia Procedure Notes (Signed)
Procedure Name: Intubation Date/Time: 04/06/2021 9:32 AM Performed by: West Pugh, CRNA Pre-anesthesia Checklist: Patient identified, Emergency Drugs available, Suction available, Patient being monitored and Timeout performed Patient Re-evaluated:Patient Re-evaluated prior to induction Oxygen Delivery Method: Circle system utilized Preoxygenation: Pre-oxygenation with 100% oxygen Induction Type: IV induction Ventilation: Mask ventilation without difficulty Laryngoscope Size: Mac and 3 Grade View: Grade II Tube type: Oral Tube size: 7.0 mm Number of attempts: 1 Airway Equipment and Method: Stylet Placement Confirmation: ETT inserted through vocal cords under direct vision, positive ETCO2, CO2 detector and breath sounds checked- equal and bilateral Secured at: 21 cm Tube secured with: Tape Dental Injury: Teeth and Oropharynx as per pre-operative assessment

## 2021-04-06 NOTE — Telephone Encounter (Signed)
Received call from Ms. Hyams re: post-op appointment. Patient states "My surgery was moved up, can I move my post op appointment up too?" Post-op appointment rescheduled to 04/26/21 at 2:15pm. Patient is in agreement of date and time of appointment.

## 2021-04-06 NOTE — Interval H&P Note (Signed)
History and Physical Interval Note:  04/06/2021 7:26 AM  Katherine Kerr  has presented today for surgery, with the diagnosis of PELVIC MASS.  The various methods of treatment have been discussed with the patient and family. After consideration of risks, benefits and other options for treatment, the patient has consented to  Procedure(s): XI ROBOTIC ASSISTED BILATERAL SALPINGO OOPHORECTOMY;POSSIBLE LAPAROTOMY WITH STAGING (Bilateral) TRACHELECTOMY (N/A) as a surgical intervention.  The patient's history has been reviewed, patient examined, no change in status, stable for surgery.  I have reviewed the patient's chart and labs.  Questions were answered to the patient's satisfaction.     Lafonda Mosses

## 2021-04-06 NOTE — Discharge Instructions (Addendum)
04/06/2021  Return to work: 4-6 weeks if applicable  Today, Dr. Berline Lopes removed both ovaries and fallopian tubes. The cyst was on the right ovary. The pathologist DID NOT feel this was a cancer. The final pathology normally comes back in around 1 week so we will notify you of those results.   Activity: 1. Be up and out of the bed during the day.  Take a nap if needed.  You may walk up steps but be careful and use the hand rail.  Stair climbing will tire you more than you think, you may need to stop part way and rest.   2. No lifting or straining over 10 lbs, pushing, pulling, straining for 6 weeks.  3. No driving for 1 week(s).  Do not drive if you are taking narcotic pain medicine. You need to make sure your reaction time has returned and you can brake safely.  4. Shower daily.  Use your regular soap to bathe and when finished pat your incision dry; don't rub.  No tub baths until cleared by your surgeon.   5. No sexual activity and nothing in the vagina for 4 weeks.  6. You may experience a small amount of clear drainage from your incisions, which is normal.  If the drainage persists or increases, please call the office.  7. Take Tylenol or ibuprofen first for pain and only use narcotic pain medication for severe pain not relieved by the Tylenol or Ibuprofen.  Monitor your Tylenol intake to a max of 4,000 mg.   Diet: 1. Low sodium Heart Healthy Diet is recommended.  2. It is safe to use a laxative, such as Miralax or Colace, if you have difficulty moving your bowels. You can take Sennakot at bedtime every evening to keep bowel movements regular and to prevent constipation.    Wound Care: 1. Keep clean and dry.  Shower daily.  Reasons to call the Doctor: Fever - Oral temperature greater than 100.4 degrees Fahrenheit Foul-smelling vaginal discharge Difficulty urinating Nausea and vomiting Increased pain at the site of the incision that is unrelieved with pain medicine. Difficulty  breathing with or without chest pain New calf pain especially if only on one side Sudden, continuing increased vaginal bleeding with or without clots.   Contacts: For questions or concerns you should contact:  Dr. Jeral Pinch at [phone]  Joylene John, NP at [phone]  After Hours: call [phone] and have the GYN Oncologist paged/contacted

## 2021-04-06 NOTE — Transfer of Care (Signed)
Immediate Anesthesia Transfer of Care Note  Patient: Katherine Kerr  Procedure(s) Performed: XI ROBOTIC ASSISTED BILATERAL SALPINGO OOPHORECTOMY (Bilateral: Abdomen)  Patient Location: PACU  Anesthesia Type:General  Level of Consciousness: awake, alert  and patient cooperative  Airway & Oxygen Therapy: Patient Spontanous Breathing and Patient connected to face mask oxygen  Post-op Assessment: Report given to RN and Post -op Vital signs reviewed and stable  Post vital signs: Reviewed and stable  Last Vitals:  Vitals Value Taken Time  BP 133/72 04/06/21 1130  Temp    Pulse 63 04/06/21 1135  Resp 9 04/06/21 1135  SpO2 100 % 04/06/21 1135  Vitals shown include unvalidated device data.  Last Pain:  Vitals:   04/06/21 0812  TempSrc: Oral  PainSc:       Patients Stated Pain Goal: 3 ([id number])  Complications: No notable events documented.

## 2021-04-06 NOTE — Anesthesia Preprocedure Evaluation (Signed)
Anesthesia Evaluation  Patient identified by MRN, date of birth, ID band Patient awake  General Assessment Comment:Prolonged muscle relaxation with Anectine in 1980's  Reviewed: Allergy & Precautions, NPO status , Patient's Chart, lab work & pertinent test results  History of Anesthesia Complications (+) PSEUDOCHOLINESTERASE DEFICIENCY and history of anesthetic complications  Airway Mallampati: II  TM Distance: >3 FB Neck ROM: Full    Dental no notable dental hx. (+) Teeth Intact, Dental Advisory Given   Pulmonary neg pulmonary ROS,    Pulmonary exam normal breath sounds clear to auscultation       Cardiovascular negative cardio ROS Normal cardiovascular exam Rhythm:Regular Rate:Normal     Neuro/Psych negative neurological ROS  negative psych ROS   GI/Hepatic negative GI ROS, Neg liver ROS,   Endo/Other  negative endocrine ROS  Renal/GU negative Renal ROS  negative genitourinary   Musculoskeletal negative musculoskeletal ROS (+)   Abdominal   Peds  Hematology  (+) anemia ,   Anesthesia Other Findings   Reproductive/Obstetrics Pelvic mass Abnormal tumor markers                             Anesthesia Physical Anesthesia Plan  ASA: 2  Anesthesia Plan: General   Post-op Pain Management:    Induction: Intravenous  PONV Risk Score and Plan: 4 or greater and Treatment may vary due to age or medical condition, Ondansetron and Dexamethasone  Airway Management Planned: Oral ETT  Additional Equipment:   Intra-op Plan:   Post-operative Plan: Extubation in OR  Informed Consent: I have reviewed the patients History and Physical, chart, labs and discussed the procedure including the risks, benefits and alternatives for the proposed anesthesia with the patient or authorized representative who has indicated his/her understanding and acceptance.     Dental advisory given  Plan  Discussed with: Anesthesiologist and CRNA  Anesthesia Plan Comments:         Anesthesia Quick Evaluation

## 2021-04-07 ENCOUNTER — Encounter (HOSPITAL_COMMUNITY): Payer: Self-pay | Admitting: Gynecologic Oncology

## 2021-04-07 ENCOUNTER — Telehealth: Payer: Self-pay

## 2021-04-07 NOTE — Telephone Encounter (Signed)
Spoke with Ms. Woodbeck this afternoon. She states she is eating, drinking and urinating well. She has not had a BM yet but is passing gas. She is taking senokot as prescribed and encouraged her to drink plenty of water. Patient states "I have some shoulder discomfort, probably from the gas used in surgery." Encouraged patient to be up and moving and apply heating pad to shoulders if needed. She denies fever or chills. Incisions are dry and intact. Her pain is controlled with ibuprofen and tylenol.  Patient wondering when she will be able to travel and fly. She originally planned a trip for 10/27. Per Joylene John, NP ok to fly 10/27, patient needs to ambulate on the plane to avoid blood clots. Patient verbalized understanding.   Instructed to call office with any fever, chills, purulent drainage, uncontrolled pain or any other questions or concerns. Patient verbalizes understanding.   Pt aware of post op appointments as well as the office number [phone] and after hours number [phone] to call if she has any questions or concerns

## 2021-04-10 LAB — CYTOLOGY - NON PAP

## 2021-04-10 LAB — SURGICAL PATHOLOGY

## 2021-04-11 ENCOUNTER — Telehealth: Payer: Self-pay

## 2021-04-11 ENCOUNTER — Encounter (HOSPITAL_COMMUNITY): Payer: Medicare Other

## 2021-04-11 NOTE — Telephone Encounter (Signed)
Returning call to patient re: Covid booster and flu shot. Patient inquiring if she is able to get both immunizations with her recent surgery on 04/06/21. Per Dr. Berline Lopes it is fine to proceed with vaccinations. Patient verbalized understanding and states she will schedule these soon.

## 2021-04-12 ENCOUNTER — Other Ambulatory Visit: Payer: Self-pay | Admitting: Family Medicine

## 2021-04-12 DIAGNOSIS — Z1231 Encounter for screening mammogram for malignant neoplasm of breast: Secondary | ICD-10-CM

## 2021-04-24 ENCOUNTER — Encounter: Payer: Self-pay | Admitting: Gynecologic Oncology

## 2021-04-26 ENCOUNTER — Other Ambulatory Visit: Payer: Self-pay

## 2021-04-26 ENCOUNTER — Inpatient Hospital Stay (HOSPITAL_BASED_OUTPATIENT_CLINIC_OR_DEPARTMENT_OTHER): Payer: Medicare Other | Admitting: Gynecologic Oncology

## 2021-04-26 ENCOUNTER — Encounter: Payer: Self-pay | Admitting: Gynecologic Oncology

## 2021-04-26 VITALS — BP 115/69 | HR 81 | Temp 98.5°F | Resp 18 | Wt 145.4 lb

## 2021-04-26 DIAGNOSIS — D270 Benign neoplasm of right ovary: Secondary | ICD-10-CM

## 2021-04-26 DIAGNOSIS — R1900 Intra-abdominal and pelvic swelling, mass and lump, unspecified site: Secondary | ICD-10-CM

## 2021-04-26 DIAGNOSIS — Z90722 Acquired absence of ovaries, bilateral: Secondary | ICD-10-CM

## 2021-04-26 NOTE — Patient Instructions (Signed)
You are healing very well from surgery!  Remember, no heavy lifting for 6 weeks after surgery.  I will send a note from today back to your gynecologist letting them know that I am releasing you back to their care.  If you ever need anything in the future, please do not hesitate to contact me.

## 2021-04-26 NOTE — Progress Notes (Signed)
Gynecologic Oncology Return Clinic Visit  04/26/2021  Reason for Visit: Postoperative follow-up  Treatment History: 04/06/2021: Robotic BSO in the setting of complex pelvic mass and elevated CA-125.  Final pathology revealed a serous cystoadenofibroma of the right ovary.  No malignancy seen.  Interval History: Patient presents today for follow-up after surgery.  She overall is doing very well.  She endorses a good appetite without nausea or emesis.  She endorses regular bowel and bladder function.  She denies any abdominal or pelvic pain.  Past Medical/Surgical History: Past Medical History:  Diagnosis Date   Anemia    history of prior to hysterectomy   Endometriosis    Fibroid    History of kidney stones    Osteopenia 07/07/2013   spine only -1.2   Scabies     Past Surgical History:  Procedure Laterality Date   EYE SURGERY     laser procedure in office   hysteroscopic resection  10/1998   ROBOTIC ASSISTED BILATERAL SALPINGO OOPHERECTOMY Bilateral 04/06/2021   Procedure: XI ROBOTIC ASSISTED BILATERAL SALPINGO OOPHORECTOMY;  Surgeon: Lafonda Mosses, MD;  Location: [facility];  Service: Gynecology;  Laterality: Bilateral;   SUPRACERVICAL ABDOMINAL HYSTERECTOMY  2001   Supracervical laparoscopic hysterectomy, retained ovaries   VARICOSE VEIN SURGERY  2011,2012,2013    Family History  Problem Relation Age of Onset   Hypertension Mother    Heart attack Mother        pacemaker   Heart disease Father    Stroke Father    Cancer Father        prostate   Cancer Maternal Grandfather        liver   Colon cancer Neg Hx    Breast cancer Neg Hx    Ovarian cancer Neg Hx    Endometrial cancer Neg Hx    Pancreatic cancer Neg Hx     Social History   Socioeconomic History   Marital status: Married    Spouse name: Not on file   Number of children: Not on file   Years of education: Not on file   Highest education level: Not on file  Occupational History   Not on file   Tobacco Use   Smoking status: Never   Smokeless tobacco: Never  Vaping Use   Vaping Use: Never used  Substance and Sexual Activity   Alcohol use: Yes    Alcohol/week: 2.0 - 3.0 standard drinks    Types: 2 - 3 Standard drinks or equivalent per week   Drug use: No   Sexual activity: Not Currently    Partners: Male    Birth control/protection: Surgical    Comment: LAVH  Other Topics Concern   Not on file  Social History Narrative   Not on file   Social Determinants of Health   Financial Resource Strain: Not on file  Food Insecurity: Not on file  Transportation Needs: Not on file  Physical Activity: Not on file  Stress: Not on file  Social Connections: Not on file    Current Medications:  Current Outpatient Medications:    BIOTIN PO, Take 5,000 mcg by mouth daily., Disp: , Rfl:    Calcium-Vitamin D-Vitamin K (VIACTIV PO), Take 2 tablets by mouth daily., Disp: , Rfl:    fluticasone (FLONASE) 50 MCG/ACT nasal spray, 2 sprays daily., Disp: , Rfl:    ibuprofen (ADVIL) 800 MG tablet, Take 1 tablet (800 mg total) by mouth every 8 (eight) hours as needed for moderate pain. For AFTER surgery, Disp:  30 tablet, Rfl: 1   melatonin 5 MG TABS, Take 5 mg by mouth at bedtime., Disp: , Rfl:    VITAMIN D PO, Take 1,000 Units by mouth daily., Disp: , Rfl:    cyclobenzaprine (FLEXERIL) 5 MG tablet, Take 1 tablet (5 mg total) by mouth 3 (three) times daily as needed for muscle spasms. (Patient not taking: Reported on 04/24/2021), Disp: 20 tablet, Rfl: 0   senna-docusate (SENOKOT-S) 8.6-50 MG tablet, Take 2 tablets by mouth at bedtime. For AFTER surgery, do not take if having loose stools/diarrhea (Patient not taking: Reported on 04/24/2021), Disp: 30 tablet, Rfl: 0   traMADol (ULTRAM) 50 MG tablet, Take 1 tablet (50 mg total) by mouth every 6 (six) hours as needed for severe pain. For AFTER surgery only, do not take and drive (Patient not taking: Reported on 04/24/2021), Disp: 10 tablet, Rfl:  0  Review of Systems: Denies appetite changes, fevers, chills, fatigue, unexplained weight changes. Denies hearing loss, neck lumps or masses, mouth sores, ringing in ears or voice changes. Denies cough or wheezing.  Denies shortness of breath. Denies chest pain or palpitations. Denies leg swelling. Denies abdominal distention, pain, blood in stools, constipation, diarrhea, nausea, vomiting, or early satiety. Denies pain with intercourse, dysuria, frequency, hematuria or incontinence. Denies hot flashes, pelvic pain, vaginal bleeding or vaginal discharge.   Denies joint pain, back pain or muscle pain/cramps. Denies itching, rash, or wounds. Denies dizziness, headaches, numbness or seizures. Denies swollen lymph nodes or glands, denies easy bruising or bleeding. Denies anxiety, depression, confusion, or decreased concentration.  Physical Exam: BP 115/69 (BP Location: Right Arm)   Pulse 81   Temp 98.5 F (36.9 C) (Oral)   Resp 18   Wt 145 lb 6 oz (65.9 kg)   LMP 12/31/1999   SpO2 99%   BMI 22.43 kg/m  General: Alert, oriented, no acute distress. HEENT: Normocephalic, atraumatic, sclera anicteric. Chest: Unlabored breathing on room air. Abdomen: soft, nontender.  Normoactive bowel sounds.  No masses or hepatosplenomegaly appreciated.  Well-healing incisions.  Remaining Dermabond removed as well as mattress sutures excised. Extremities: Grossly normal range of motion.  Warm, well perfused.  No edema bilaterally.  Laboratory & Radiologic Studies: None new  Assessment & Plan: Katherine Kerr is a 65 y.o. woman status post robotic BSO for complex adnexal mass approximately 3 weeks ago with benign final pathology.  Patient is doing very well from a postoperative standpoint and meeting all milestones.  Discussed continued activity restrictions.  Reviewed with her findings from super surgery as well as pictures that I took.  Patient was given a copy of her final pathology report which  we reviewed again today in detail.  Releasing her back to her GYN.  24 minutes of total time was spent for this patient encounter, including preparation, face-to-face counseling with the patient and coordination of care, and documentation of the encounter.  Jeral Pinch, MD  Division of Gynecologic Oncology  Department of Obstetrics and Gynecology  [facility]

## 2021-04-27 ENCOUNTER — Telehealth: Payer: Self-pay | Admitting: Obstetrics and Gynecology

## 2021-04-27 ENCOUNTER — Telehealth: Payer: Medicare Other | Admitting: Gynecologic Oncology

## 2021-04-27 NOTE — Telephone Encounter (Signed)
Mychart message was sent.

## 2021-05-10 ENCOUNTER — Encounter: Payer: Medicare Other | Admitting: Gynecologic Oncology

## 2021-05-11 ENCOUNTER — Other Ambulatory Visit: Payer: Self-pay

## 2021-05-11 ENCOUNTER — Ambulatory Visit
Admission: RE | Admit: 2021-05-11 | Discharge: 2021-05-11 | Disposition: A | Discharge status: Home / Self Care | Payer: Medicare Other | Source: Ambulatory Visit | Attending: Family Medicine | Admitting: Family Medicine

## 2021-05-11 DIAGNOSIS — Z1231 Encounter for screening mammogram for malignant neoplasm of breast: Secondary | ICD-10-CM

## 2021-05-12 ENCOUNTER — Other Ambulatory Visit: Payer: Self-pay | Admitting: Family Medicine

## 2021-05-12 DIAGNOSIS — R928 Other abnormal and inconclusive findings on diagnostic imaging of breast: Secondary | ICD-10-CM

## 2021-05-15 NOTE — Progress Notes (Signed)
65 y.o. G34P1001 Married White or Caucasian Not Hispanic or Latino female here for annual exam.     04/06/2021: Robotic BSO in the setting of complex pelvic mass and elevated CA-125.  Final pathology revealed a serous cystoadenofibroma of the right ovary.  No malignancy seen. Surgery was done with Dr Berline Lopes.   Prior h/o supracervical hysterectomy.   No bowel or bladder c/o. Not sexually active.   Patient's last menstrual period was 12/31/1999.          Sexually active: No.  The current method of family planning is status post hysterectomy.    Exercising: Yes.     Walking  Smoker:  no  Health Maintenance: Pap:   09/10/18 negative, negative HPV; 06-19-16 neg (supracervical hysterectomy) History of abnormal Pap:  no MMG:  05/11/21 incomplete follow up U/S 06/12/21 BMD:   02/17/19  -1.1 right hip  Colonoscopy: 2017 f/u 10 years  TDaP:  2017  Gardasil: n/a   reports that she has never smoked. She has never used smokeless tobacco. She reports current alcohol use of about 2.0 - 3.0 standard drinks per week. She reports that she does not use drugs. Retired, Psychologist, occupational work. 2 daughters (one adopted). No grandchildren. Elderly parents out of state.   Past Medical History:  Diagnosis Date   Anemia    history of prior to hysterectomy   Endometriosis    Fibroid    History of kidney stones    Osteopenia 07/07/2013   spine only -1.2   Scabies     Past Surgical History:  Procedure Laterality Date   EYE SURGERY     laser procedure in office   hysteroscopic resection  10/1998   ROBOTIC ASSISTED BILATERAL SALPINGO OOPHERECTOMY Bilateral 04/06/2021   Procedure: XI ROBOTIC ASSISTED BILATERAL SALPINGO OOPHORECTOMY;  Surgeon: Lafonda Mosses, MD;  Location: [facility];  Service: Gynecology;  Laterality: Bilateral;   SUPRACERVICAL ABDOMINAL HYSTERECTOMY  2001   Supracervical laparoscopic hysterectomy, retained ovaries   VARICOSE VEIN SURGERY  2011,2012,2013    Current Outpatient Medications   Medication Sig Dispense Refill   BIOTIN PO Take 5,000 mcg by mouth daily.     Calcium-Vitamin D-Vitamin K (VIACTIV PO) Take 2 tablets by mouth daily.     fluticasone (FLONASE) 50 MCG/ACT nasal spray 2 sprays daily.     ibuprofen (ADVIL) 800 MG tablet Take 1 tablet (800 mg total) by mouth every 8 (eight) hours as needed for moderate pain. For AFTER surgery 30 tablet 1   melatonin 5 MG TABS Take 5 mg by mouth at bedtime.     senna-docusate (SENOKOT-S) 8.6-50 MG tablet Take 2 tablets by mouth at bedtime. For AFTER surgery, do not take if having loose stools/diarrhea 30 tablet 0   VITAMIN D PO Take 1,000 Units by mouth daily.     No current facility-administered medications for this visit.    Family History  Problem Relation Age of Onset   Hypertension Mother    Heart attack Mother        pacemaker   Heart disease Father    Stroke Father    Cancer Father        prostate   Cancer Maternal Grandfather        liver   Colon cancer Neg Hx    Breast cancer Neg Hx    Ovarian cancer Neg Hx    Endometrial cancer Neg Hx    Pancreatic cancer Neg Hx     Review of Systems  All other systems  reviewed and are negative.  Exam:   BP 126/74   Pulse 70   Ht 5' 6.14" (1.68 m)   Wt 146 lb (66.2 kg)   LMP 12/31/1999   SpO2 100%   BMI 23.46 kg/m   Weight change: @WEIGHTCHANGE @ Height:   Height: 5' 6.14" (168 cm)  Ht Readings from Last 3 Encounters:  05/22/21 5' 6.14" (1.68 m)  04/06/21 5' 7.5" (1.715 m)  03/29/21 5' 7.5" (1.715 m)    General appearance: alert, cooperative and appears stated age Head: Normocephalic, without obvious abnormality, atraumatic Neck: no adenopathy, supple, symmetrical, trachea midline and thyroid normal to inspection and palpation Breasts: normal appearance, no masses or tenderness Abdomen: soft, non-tender; non distended,  no masses,  no organomegaly Extremities: extremities normal, atraumatic, no cyanosis or edema Skin: Skin color, texture, turgor normal. No  rashes or lesions Lymph nodes: Cervical, supraclavicular, and axillary nodes normal. No abnormal inguinal nodes palpated Neurologic: Grossly normal   Pelvic: External genitalia:  no lesions              Urethra:  normal appearing urethra with no masses, tenderness or lesions              Bartholins and Skenes: normal                 Vagina: mildly atrophic appearing vagina with a small grade 2 cystocele, grade 1-2 rectocele, grade 1 cervical prolapse. Patient examined supine with and without valsalva              Cervix: no lesions               Bimanual Exam:  Uterus:  uterus absent              Adnexa: no mass, fullness, tenderness               Rectovaginal: Confirms               Anus:  normal sphincter tone, no lesions  Glorianne Manchester, RN chaperoned for the exam.  1. Encounter for gynecological examination with abnormal finding Discussed breast self exam Labs with primary Mammogram f/u is due, scheduled Colonoscopy is UTD  2. Osteopenia, unspecified location On calcium and vit d - DG Bone Density; Future  3. Hypoestrogenism - DG Bone Density; Future  4. Cystocele, midline Not symptomatic Avoid heavy lifting and straining ACOG handout given and reviewed  5. Rectocele Not symptomatic Avoid heavy lifting and straining

## 2021-05-22 ENCOUNTER — Ambulatory Visit (INDEPENDENT_AMBULATORY_CARE_PROVIDER_SITE_OTHER): Payer: Medicare Other | Admitting: Obstetrics and Gynecology

## 2021-05-22 ENCOUNTER — Encounter: Payer: Self-pay | Admitting: Obstetrics and Gynecology

## 2021-05-22 ENCOUNTER — Other Ambulatory Visit: Payer: Self-pay

## 2021-05-22 VITALS — BP 126/74 | HR 70 | Ht 66.14 in | Wt 146.0 lb

## 2021-05-22 DIAGNOSIS — M8580 Other specified disorders of bone density and structure, unspecified site: Secondary | ICD-10-CM

## 2021-05-22 DIAGNOSIS — Z01411 Encounter for gynecological examination (general) (routine) with abnormal findings: Secondary | ICD-10-CM

## 2021-05-22 DIAGNOSIS — N8111 Cystocele, midline: Secondary | ICD-10-CM

## 2021-05-22 DIAGNOSIS — E2839 Other primary ovarian failure: Secondary | ICD-10-CM

## 2021-05-22 DIAGNOSIS — N816 Rectocele: Secondary | ICD-10-CM | POA: Diagnosis not present

## 2021-05-22 NOTE — Patient Instructions (Signed)

## 2021-06-07 ENCOUNTER — Other Ambulatory Visit: Payer: Self-pay | Admitting: Obstetrics and Gynecology

## 2021-06-07 DIAGNOSIS — M8580 Other specified disorders of bone density and structure, unspecified site: Secondary | ICD-10-CM

## 2021-06-07 DIAGNOSIS — E2839 Other primary ovarian failure: Secondary | ICD-10-CM

## 2021-06-12 ENCOUNTER — Ambulatory Visit
Admission: RE | Admit: 2021-06-12 | Discharge: 2021-06-12 | Disposition: A | Discharge status: Home / Self Care | Payer: Medicare Other | Source: Ambulatory Visit | Attending: Family Medicine | Admitting: Family Medicine

## 2021-06-12 ENCOUNTER — Other Ambulatory Visit: Payer: Self-pay | Admitting: Family Medicine

## 2021-06-12 DIAGNOSIS — N6310 Unspecified lump in the right breast, unspecified quadrant: Secondary | ICD-10-CM

## 2021-06-12 DIAGNOSIS — R928 Other abnormal and inconclusive findings on diagnostic imaging of breast: Secondary | ICD-10-CM

## 2021-07-05 ENCOUNTER — Telehealth: Payer: Medicare Other | Admitting: Nurse Practitioner

## 2021-07-05 DIAGNOSIS — U071 COVID-19: Secondary | ICD-10-CM

## 2021-07-05 MED ORDER — NIRMATRELVIR/RITONAVIR (PAXLOVID)TABLET
3.0000 | ORAL_TABLET | Freq: Two times a day (BID) | ORAL | 0 refills | Status: AC
Start: 2021-07-05 — End: 2021-07-10

## 2021-07-05 NOTE — Progress Notes (Signed)
Virtual Visit Consent   Katherine Kerr, you are scheduled for a virtual visit with a [facility] provider today.     Just as with appointments in the office, your consent must be obtained to participate.  Your consent will be active for this visit and any virtual visit you may have with one of our providers in the next 365 days.     If you have a MyChart account, a copy of this consent can be sent to you electronically.  All virtual visits are billed to your insurance company just like a traditional visit in the office.    As this is a virtual visit, video technology does not allow for your provider to perform a traditional examination.  This may limit your provider's ability to fully assess your condition.  If your provider identifies any concerns that need to be evaluated in person or the need to arrange testing (such as labs, EKG, etc.), we will make arrangements to do so.     Although advances in technology are sophisticated, we cannot ensure that it will always work on either your end or our end.  If the connection with a video visit is poor, the visit may have to be switched to a telephone visit.  With either a video or telephone visit, we are not always able to ensure that we have a secure connection.     I need to obtain your verbal consent now.   Are you willing to proceed with your visit today?    [name] has provided verbal consent on 07/05/2021 for a virtual visit (video or telephone).   Apolonio Schneiders, FNP   Date: 07/05/2021 5:47 PM   Virtual Visit via Video Note   I, Apolonio Schneiders, connected with  [name]  ([id number], July 15, 1955) on 07/05/21 at  5:45 PM EST by a video-enabled telemedicine application and verified that I am speaking with the correct person using two identifiers.  Location: Patient: Virtual Visit Location Patient: Home Provider: Virtual Visit Location Provider: Home Office   I discussed the limitations of evaluation and management by  telemedicine and the availability of in person appointments. The patient expressed understanding and agreed to proceed.    History of Present Illness: Katherine Kerr is a 66 y.o. who identifies as a female who was assigned female at birth, and is being seen today after testing positive for COVID.  She started to have nasal congestion and a cough last night. Today she took a COVID test and it was positive.   She not had COVID in the past  She has been vaccinated including most recent booster for COVID   Denies a history of asthma  Denies need for inhaler in the past  Denies a history of bronchitis or pneumonia   GFR was 82 on 03/14/21  Problems:  Patient Active Problem List   Diagnosis Date Noted   Right flank pain 04/03/2021   Eustachian tube dysfunction, bilateral 01/10/2017    Allergies:  Allergies  Allergen Reactions   Anectine [Succinylcholine Chloride]     Has trouble coming out of this anesthesia   Amoxicillin-Pot Clavulanate Rash   Medications:  Current Outpatient Medications:    BIOTIN PO, Take 5,000 mcg by mouth daily., Disp: , Rfl:    Calcium-Vitamin D-Vitamin K (VIACTIV PO), Take 2 tablets by mouth daily., Disp: , Rfl:    fluticasone (FLONASE) 50 MCG/ACT nasal spray, 2 sprays daily., Disp: , Rfl:    ibuprofen (ADVIL) 800  MG tablet, Take 1 tablet (800 mg total) by mouth every 8 (eight) hours as needed for moderate pain. For AFTER surgery, Disp: 30 tablet, Rfl: 1   melatonin 5 MG TABS, Take 5 mg by mouth at bedtime., Disp: , Rfl:    senna-docusate (SENOKOT-S) 8.6-50 MG tablet, Take 2 tablets by mouth at bedtime. For AFTER surgery, do not take if having loose stools/diarrhea, Disp: 30 tablet, Rfl: 0   VITAMIN D PO, Take 1,000 Units by mouth daily., Disp: , Rfl:   Observations/Objective: Patient is well-developed, well-nourished in no acute distress.  Resting comfortably at home.  Head is normocephalic, atraumatic.  No labored breathing.  Speech is clear and  coherent with logical content.  Patient is alert and oriented at baseline.    Assessment and Plan: 1. COVID-19 Take anti-viral with food  - nirmatrelvir/ritonavir EUA (PAXLOVID) 20 x 150 MG & 10 x 100MG  TABS; Take 3 tablets by mouth 2 (two) times daily for 5 days. (Take nirmatrelvir 150 mg two tablets twice daily for 5 days and ritonavir 100 mg one tablet twice daily for 5 days) Patient GFR is 82  Dispense: 30 tablet; Refill: 0    Continue to manage symptoms with over the counter medication as discussed.  Push fluids and rest  Immune support as discussed  Follow Up Instructions: I discussed the assessment and treatment plan with the patient. The patient was provided an opportunity to ask questions and all were answered. The patient agreed with the plan and demonstrated an understanding of the instructions.  A copy of instructions were sent to the patient via MyChart unless otherwise noted below.    The patient was advised to call back or seek an in-person evaluation if the symptoms worsen or if the condition fails to improve as anticipated.  Time:  I spent 15 minutes with the patient via telehealth technology discussing the above problems/concerns.    Apolonio Schneiders, FNP

## 2021-11-01 ENCOUNTER — Ambulatory Visit
Admission: RE | Admit: 2021-11-01 | Discharge: 2021-11-01 | Disposition: A | Discharge status: Home / Self Care | Payer: Medicare Other | Source: Ambulatory Visit | Attending: Obstetrics and Gynecology | Admitting: Obstetrics and Gynecology

## 2021-11-01 DIAGNOSIS — M8580 Other specified disorders of bone density and structure, unspecified site: Secondary | ICD-10-CM

## 2021-11-01 DIAGNOSIS — E2839 Other primary ovarian failure: Secondary | ICD-10-CM

## 2021-11-13 ENCOUNTER — Telehealth: Payer: Self-pay | Admitting: Family Medicine

## 2021-11-13 NOTE — Telephone Encounter (Signed)
Left message for patient to call back and schedule Medicare Annual Wellness Visit (AWV) either virtually or in office. Left  my jabber number [phone]   awvi 10/30/21 per [facility] ; please schedule at anytime with [facility] Nurse Health Advisor 1 or 2   This should be a 45 minute visit.  

## 2021-11-20 ENCOUNTER — Ambulatory Visit (INDEPENDENT_AMBULATORY_CARE_PROVIDER_SITE_OTHER): Payer: Medicare Other

## 2021-11-20 VITALS — Ht 67.0 in | Wt 145.0 lb

## 2021-11-20 DIAGNOSIS — Z0000 Encounter for general adult medical examination without abnormal findings: Secondary | ICD-10-CM | POA: Diagnosis not present

## 2021-11-20 NOTE — Patient Instructions (Signed)
Ms. Katherine Kerr , Thank you for taking time to come for your Medicare Wellness Visit. I appreciate your ongoing commitment to your health goals. Please review the following plan we discussed and let me know if I can assist you in the future.   Screening recommendations/referrals: Colonoscopy: completed 08/02/2015, due 08/01/2025 Mammogram: completed 05/11/2021, due 05/12/2022 Bone Density: completed 11/01/2021 Recommended yearly ophthalmology/optometry visit for glaucoma screening and checkup Recommended yearly dental visit for hygiene and checkup  Vaccinations: Influenza vaccine: due 01/30/2022 Pneumococcal vaccine: due Tdap vaccine: completed 06/19/2016, due 06/19/2026 Shingles vaccine: completed    Covid-19: 09/27/2019, 10/18/2019, 05/20/2020, 10/12/2020, 04/21/2021  Advanced directives: Please bring a copy of your POA (Power of Attorney) and/or Living Will to your next appointment.   Conditions/risks identified: none  Next appointment: Follow up in one year for your annual wellness visit    Preventive Care 66 Years and Older, Female Preventive care refers to lifestyle choices and visits with your health care provider that can promote health and wellness. What does preventive care include? A yearly physical exam. This is also called an annual well check. Dental exams once or twice a year. Routine eye exams. Ask your health care provider how often you should have your eyes checked. Personal lifestyle choices, including: Daily care of your teeth and gums. Regular physical activity. Eating a healthy diet. Avoiding tobacco and drug use. Limiting alcohol use. Practicing safe sex. Taking low-dose aspirin every day. Taking vitamin and mineral supplements as recommended by your health care provider. What happens during an annual well check? The services and screenings done by your health care provider during your annual well check will depend on your age, overall health, lifestyle risk factors, and  family history of disease. Counseling  Your health care provider may ask you questions about your: Alcohol use. Tobacco use. Drug use. Emotional well-being. Home and relationship well-being. Sexual activity. Eating habits. History of falls. Memory and ability to understand (cognition). Work and work Statistician. Reproductive health. Screening  You may have the following tests or measurements: Height, weight, and BMI. Blood pressure. Lipid and cholesterol levels. These may be checked every 5 years, or more frequently if you are over 66 years old. Skin check. Lung cancer screening. You may have this screening every year starting at age 66 if you have a 30-pack-year history of smoking and currently smoke or have quit within the past 15 years. Fecal occult blood test (FOBT) of the stool. You may have this test every year starting at age 66. Flexible sigmoidoscopy or colonoscopy. You may have a sigmoidoscopy every 5 years or a colonoscopy every 10 years starting at age 66. Hepatitis C blood test. Hepatitis B blood test. Sexually transmitted disease (STD) testing. Diabetes screening. This is done by checking your blood sugar (glucose) after you have not eaten for a while (fasting). You may have this done every 1-3 years. Bone density scan. This is done to screen for osteoporosis. You may have this done starting at age 66. Mammogram. This may be done every 1-2 years. Talk to your health care provider about how often you should have regular mammograms. Talk with your health care provider about your test results, treatment options, and if necessary, the need for more tests. Vaccines  Your health care provider may recommend certain vaccines, such as: Influenza vaccine. This is recommended every year. Tetanus, diphtheria, and acellular pertussis (Tdap, Td) vaccine. You may need a Td booster every 10 years. Zoster vaccine. You may need this after age 32. Pneumococcal  13-valent conjugate  (PCV13) vaccine. One dose is recommended after age 66. Pneumococcal polysaccharide (PPSV23) vaccine. One dose is recommended after age 66. Talk to your health care provider about which screenings and vaccines you need and how often you need them. This information is not intended to replace advice given to you by your health care provider. Make sure you discuss any questions you have with your health care provider. Document Released: 07/15/2015 Document Revised: 03/07/2016 Document Reviewed: 04/19/2015 Elsevier Interactive Patient Education  2017 [place] Prevention in the Home Falls can cause injuries. They can happen to people of all ages. There are many things you can do to make your home safe and to help prevent falls. What can I do on the outside of my home? Regularly fix the edges of walkways and driveways and fix any cracks. Remove anything that might make you trip as you walk through a door, such as a raised step or threshold. Trim any bushes or trees on the path to your home. Use bright outdoor lighting. Clear any walking paths of anything that might make someone trip, such as rocks or tools. Regularly check to see if handrails are loose or broken. Make sure that both sides of any steps have handrails. Any raised decks and porches should have guardrails on the edges. Have any leaves, snow, or ice cleared regularly. Use sand or salt on walking paths during winter. Clean up any spills in your garage right away. This includes oil or grease spills. What can I do in the bathroom? Use night lights. Install grab bars by the toilet and in the tub and shower. Do not use towel bars as grab bars. Use non-skid mats or decals in the tub or shower. If you need to sit down in the shower, use a plastic, non-slip stool. Keep the floor dry. Clean up any water that spills on the floor as soon as it happens. Remove soap buildup in the tub or shower regularly. Attach bath mats securely with  double-sided non-slip rug tape. Do not have throw rugs and other things on the floor that can make you trip. What can I do in the bedroom? Use night lights. Make sure that you have a light by your bed that is easy to reach. Do not use any sheets or blankets that are too big for your bed. They should not hang down onto the floor. Have a firm chair that has side arms. You can use this for support while you get dressed. Do not have throw rugs and other things on the floor that can make you trip. What can I do in the kitchen? Clean up any spills right away. Avoid walking on wet floors. Keep items that you use a lot in easy-to-reach places. If you need to reach something above you, use a strong step stool that has a grab bar. Keep electrical cords out of the way. Do not use floor polish or wax that makes floors slippery. If you must use wax, use non-skid floor wax. Do not have throw rugs and other things on the floor that can make you trip. What can I do with my stairs? Do not leave any items on the stairs. Make sure that there are handrails on both sides of the stairs and use them. Fix handrails that are broken or loose. Make sure that handrails are as long as the stairways. Check any carpeting to make sure that it is firmly attached to the stairs. Fix any carpet  that is loose or worn. Avoid having throw rugs at the top or bottom of the stairs. If you do have throw rugs, attach them to the floor with carpet tape. Make sure that you have a light switch at the top of the stairs and the bottom of the stairs. If you do not have them, ask someone to add them for you. What else can I do to help prevent falls? Wear shoes that: Do not have high heels. Have rubber bottoms. Are comfortable and fit you well. Are closed at the toe. Do not wear sandals. If you use a stepladder: Make sure that it is fully opened. Do not climb a closed stepladder. Make sure that both sides of the stepladder are locked  into place. Ask someone to hold it for you, if possible. Clearly mark and make sure that you can see: Any grab bars or handrails. First and last steps. Where the edge of each step is. Use tools that help you move around (mobility aids) if they are needed. These include: Canes. Walkers. Scooters. Crutches. Turn on the lights when you go into a dark area. Replace any light bulbs as soon as they burn out. Set up your furniture so you have a clear path. Avoid moving your furniture around. If any of your floors are uneven, fix them. If there are any pets around you, be aware of where they are. Review your medicines with your doctor. Some medicines can make you feel dizzy. This can increase your chance of falling. Ask your doctor what other things that you can do to help prevent falls. This information is not intended to replace advice given to you by your health care provider. Make sure you discuss any questions you have with your health care provider. Document Released: 04/14/2009 Document Revised: 11/24/2015 Document Reviewed: 07/23/2014 Elsevier Interactive Patient Education  2017 [employer].

## 2021-11-20 NOTE — Progress Notes (Signed)
I connected with Katherine Kerr today by telephone and verified that I am speaking with the correct person using two identifiers. Location patient: home Location provider: work Persons participating in the virtual visit: Katherine Givens LPN.   I discussed the limitations, risks, security and privacy concerns of performing an evaluation and management service by telephone and the availability of in person appointments. I also discussed with the patient that there may be a patient responsible charge related to this service. The patient expressed understanding and verbally consented to this telephonic visit.    Interactive audio and video telecommunications were attempted between this provider and patient, however failed, due to patient having technical difficulties OR patient did not have access to video capability.  We continued and completed visit with audio only.     Vital signs may be patient reported or missing.  Subjective:   Katherine Kerr is a 66 y.o. female who presents for an Initial Medicare Annual Wellness Visit.  Review of Systems     Cardiac Risk Factors include: advanced age (>30mn, >>30women)     Objective:    Today's Vitals   11/20/21 1326  Weight: 145 lb (65.8 kg)  Height: '5\' 7"'$  (12.505m)   Body mass index is 22.71 kg/m.     11/20/2021    1:30 PM 04/24/2021   11:30 AM 04/05/2021    3:19 PM 03/30/2021    2:06 PM  Advanced Directives  Does Patient Have a Medical Advance Directive? Yes Yes Yes Yes  Type of [place] Living will Living will   Does patient want to make changes to medical advance directive?    No - Patient declined  Copy of [place] Chart? No - copy requested       Current Medications (verified) Outpatient Encounter Medications as of 11/20/2021  Medication Sig   BIOTIN PO Take 5,000 mcg by mouth daily.   Calcium-Vitamin D-Vitamin K (VIACTIV PO) Take 2 tablets by  mouth daily.   fluticasone (FLONASE) 50 MCG/ACT nasal spray 2 sprays daily.   ibuprofen (ADVIL) 800 MG tablet Take 1 tablet (800 mg total) by mouth every 8 (eight) hours as needed for moderate pain. For AFTER surgery   melatonin 5 MG TABS Take 5 mg by mouth at bedtime.   VITAMIN D PO Take 1,000 Units by mouth daily.   senna-docusate (SENOKOT-S) 8.6-50 MG tablet Take 2 tablets by mouth at bedtime. For AFTER surgery, do not take if having loose stools/diarrhea (Patient not taking: Reported on 11/20/2021)   No facility-administered encounter medications on file as of 11/20/2021.    Allergies (verified) Anectine [succinylcholine chloride] and Amoxicillin-pot clavulanate   History: Past Medical History:  Diagnosis Date   Anemia    history of prior to hysterectomy   Endometriosis    Fibroid    History of kidney stones    Osteopenia 07/07/2013   spine only -1.2   Scabies    Past Surgical History:  Procedure Laterality Date   EYE SURGERY     laser procedure in office   hysteroscopic resection  10/1998   ROBOTIC ASSISTED BILATERAL SALPINGO OOPHERECTOMY Bilateral 04/06/2021   Procedure: XI ROBOTIC ASSISTED BILATERAL SALPINGO OOPHORECTOMY;  Surgeon: TLafonda Mosses MD;  Location: [facility];  Service: Gynecology;  Laterality: Bilateral;   SUPRACERVICAL ABDOMINAL HYSTERECTOMY  2001   Supracervical laparoscopic hysterectomy, retained ovaries   VARICOSE VEIN SURGERY  2011,2012,2013   Family History  Problem Relation  Age of Onset   Hypertension Mother    Heart attack Mother        pacemaker   Heart disease Father    Stroke Father    Cancer Father        prostate   Cancer Maternal Grandfather        liver   Colon cancer Neg Hx    Breast cancer Neg Hx    Ovarian cancer Neg Hx    Endometrial cancer Neg Hx    Pancreatic cancer Neg Hx    Social History   Socioeconomic History   Marital status: Married    Spouse name: Not on file   Number of children: Not on file   Years of  education: Not on file   Highest education level: Not on file  Occupational History   Not on file  Tobacco Use   Smoking status: Never   Smokeless tobacco: Never  Vaping Use   Vaping Use: Never used  Substance and Sexual Activity   Alcohol use: Yes    Alcohol/week: 2.0 - 3.0 standard drinks    Types: 2 - 3 Standard drinks or equivalent per week   Drug use: No   Sexual activity: Not Currently    Partners: Male    Birth control/protection: Surgical    Comment: LAVH  Other Topics Concern   Not on file  Social History Narrative   Not on file   Social Determinants of Health   Financial Resource Strain: Low Risk    Difficulty of Paying Living Expenses: Not hard at all  Food Insecurity: No Food Insecurity   Worried About Charity fundraiser in the Last Year: Never true   [place] in the Last Year: Never true  Transportation Needs: No Transportation Needs   Lack of Transportation (Medical): No   Lack of Transportation (Non-Medical): No  Physical Activity: Sufficiently Active   Days of Exercise per Week: 5 days   Minutes of Exercise per Session: 90 min  Stress: No Stress Concern Present   Feeling of Stress : Not at all  Social Connections: Not on file    Tobacco Counseling Counseling given: Not Answered   Clinical Intake:  Pre-visit preparation completed: Yes  Pain : No/denies pain     Nutritional Status: BMI of 19-24  Normal Diabetes: No  How often do you need to have someone help you when you read instructions, pamphlets, or other written materials from your doctor or pharmacy?: 1 - Never What is the last grade level you completed in school?: bachelors degree  Diabetic? no  Interpreter Needed?: No  Information entered by :: NAllen LPN   Activities of Daily Living    11/20/2021    1:31 PM 04/05/2021    3:17 PM  In your present state of health, do you have any difficulty performing the following activities:  Hearing? 0 0  Vision? 0 0  Difficulty  concentrating or making decisions? 0 0  Walking or climbing stairs? 0 0  Dressing or bathing? 0 0  Doing errands, shopping? 0 0  Preparing Food and eating ? N   Using the Toilet? N   In the past six months, have you accidently leaked urine? N   Do you have problems with loss of bowel control? N   Managing your Medications? N   Managing your Finances? N   Housekeeping or managing your Housekeeping? N     Patient Care Team: Billie Ruddy, MD as PCP -  General (Family Medicine)  Indicate any recent Medical Services you may have received from other than [facility] providers in the past year (date may be approximate).     Assessment:   This is a routine wellness examination for Katherine Kerr.  Hearing/Vision screen Vision Screening - Comments:: Regular eye exams, [facility], Dr. Ellie Lunch  Dietary issues and exercise activities discussed: Current Exercise Habits: Home exercise routine, Type of exercise: walking, Time (Minutes): > 60, Frequency (Times/Week): 5, Weekly Exercise (Minutes/Week): 0   Goals Addressed             This Visit's Progress    Patient Stated       11/20/2021, no goals       Depression Screen    11/20/2021    1:31 PM  PHQ 2/9 Scores  PHQ - 2 Score 0    Fall Risk    11/20/2021    1:30 PM  [place] in the past year? 0  Number falls in past yr: 0  Injury with Fall? 0  Risk for fall due to : No Fall Risks  Follow up Falls evaluation completed;Education provided;Falls prevention discussed    FALL RISK PREVENTION PERTAINING TO THE HOME:  Any stairs in or around the home? Yes  If so, are there any without handrails? No  Home free of loose throw rugs in walkways, pet beds, electrical cords, etc? Yes  Adequate lighting in your home to reduce risk of falls? Yes   ASSISTIVE DEVICES UTILIZED TO PREVENT FALLS:  Life alert? No  Use of a cane, walker or w/c? No  Grab bars in the bathroom? No  Shower chair or bench in shower? Yes  Elevated toilet  seat or a handicapped toilet? No   TIMED UP AND GO:  Was the test performed? No .       Cognitive Function:        11/20/2021    1:31 PM  6CIT Screen  What Year? 0 points  What month? 0 points  What time? 0 points  Count back from 20 0 points  Months in reverse 0 points  Repeat phrase 0 points  Total Score 0 points    Immunizations Immunization History  Administered Date(s) Administered   Influenza Split 04/17/2018   Influenza,inj,Quad PF,6+ Mos 03/22/2015, 07/04/2016, 02/22/2019   Influenza,inj,quad, With Preservative 03/22/2015, 07/04/2016   Influenza-Unspecified 04/30/2014, 03/22/2015, 07/04/2016, 04/17/2018, 04/21/2021   Pfizer Covid-19 Vaccine Bivalent Booster 54yr & up 04/21/2021   Tdap 05/12/2007, 06/19/2016   Zoster Recombinat (Shingrix) 11/19/2016, 01/28/2017   Zoster, Live 09/17/2011, 11/19/2016, 01/28/2017    TDAP status: Up to date  Flu Vaccine status: Up to date  Pneumococcal vaccine status: Due, Education has been provided regarding the importance of this vaccine. Advised may receive this vaccine at local pharmacy or Health Dept. Aware to provide a copy of the vaccination record if obtained from local pharmacy or Health Dept. Verbalized acceptance and understanding.  Covid-19 vaccine status: Completed vaccines  Qualifies for Shingles Vaccine? Yes   Zostavax completed Yes   Shingrix Completed?: Yes  Screening Tests Health Maintenance  Topic Date Due   Pneumonia Vaccine 66 Years old (1 - PCV) Never done   HIV Screening  Never done   COVID-19 Vaccine (2 - Pfizer series) 05/12/2021   PAP SMEAR-Modifier  09/09/2021   INFLUENZA VACCINE  01/30/2022   MAMMOGRAM  05/12/2023   COLONOSCOPY (Pts 45-446yrInsurance coverage will need to be confirmed)  08/01/2025   TETANUS/TDAP  06/19/2026  DEXA SCAN  Completed   Hepatitis C Screening  Completed   Zoster Vaccines- Shingrix  Completed   HPV VACCINES  Aged Out    Health Maintenance  Health  Maintenance Due  Topic Date Due   Pneumonia Vaccine 54+ Years old (1 - PCV) Never done   HIV Screening  Never done   COVID-19 Vaccine (2 - Pfizer series) 05/12/2021   PAP SMEAR-Modifier  09/09/2021    Colorectal cancer screening: Type of screening: Colonoscopy. Completed 08/02/2015. Repeat every 10 years  Mammogram status: Completed 05/11/2021. Repeat every year  Bone Density status: Completed 11/01/2021.   Lung Cancer Screening: (Low Dose CT Chest recommended if Age 75-80 years, 30 pack-year currently smoking OR have quit w/in 15years.) does not qualify.   Lung Cancer Screening Referral: no  Additional Screening:  Hepatitis C Screening: does qualify; Completed 06/19/2016  Vision Screening: Recommended annual ophthalmology exams for early detection of glaucoma and other disorders of the eye. Is the patient up to date with their annual eye exam?  Yes  Who is the provider or what is the name of the office in which the patient attends annual eye exams? Dr. Ellie Lunch If pt is not established with a provider, would they like to be referred to a provider to establish care? No .   Dental Screening: Recommended annual dental exams for proper oral hygiene  Community Resource Referral / Chronic Care Management: CRR required this visit?  No   CCM required this visit?  No      Plan:     I have personally reviewed and noted the following in the patient's chart:   Medical and social history Use of alcohol, tobacco or illicit drugs  Current medications and supplements including opioid prescriptions. Patient is not currently taking opioid prescriptions. Functional ability and status Nutritional status Physical activity Advanced directives List of other physicians Hospitalizations, surgeries, and ER visits in previous 12 months Vitals Screenings to include cognitive, depression, and falls Referrals and appointments  In addition, I have reviewed and discussed with patient certain  preventive protocols, quality metrics, and best practice recommendations. A written personalized care plan for preventive services as well as general preventive health recommendations were provided to patient.     Kellie Simmering, LPN   [id number]   Nurse Notes: none  Due to this being a virtual visit, the after visit summary with patients personalized plan was offered to patient via mail or my-chart.  Patient would like to access on my-chart

## 2021-12-12 ENCOUNTER — Ambulatory Visit
Admission: RE | Admit: 2021-12-12 | Discharge: 2021-12-12 | Disposition: A | Discharge status: Home / Self Care | Payer: Medicare Other | Source: Ambulatory Visit | Attending: Family Medicine | Admitting: Family Medicine

## 2021-12-12 ENCOUNTER — Other Ambulatory Visit: Payer: Self-pay | Admitting: Family Medicine

## 2021-12-12 DIAGNOSIS — N6310 Unspecified lump in the right breast, unspecified quadrant: Secondary | ICD-10-CM

## 2021-12-12 DIAGNOSIS — R928 Other abnormal and inconclusive findings on diagnostic imaging of breast: Secondary | ICD-10-CM

## 2021-12-27 IMAGING — MG MM DIGITAL DIAGNOSTIC UNILAT*L* W/ TOMO W/ CAD
8 series · 8 of 24 positions shown · non-contrast
Comparison: Previous exam(s).

CLINICAL DATA: 64-year-old with a possible palpable lump in the
LOWER INNER LEFT breast on recent clinical examination the
approximate 7 o'clock location.

EXAM:
DIGITAL DIAGNOSTIC LEFT MAMMOGRAM WITH CAD AND TOMO
ULTRASOUND LEFT BREAST

[L CC synth-2D (1 of 2)]
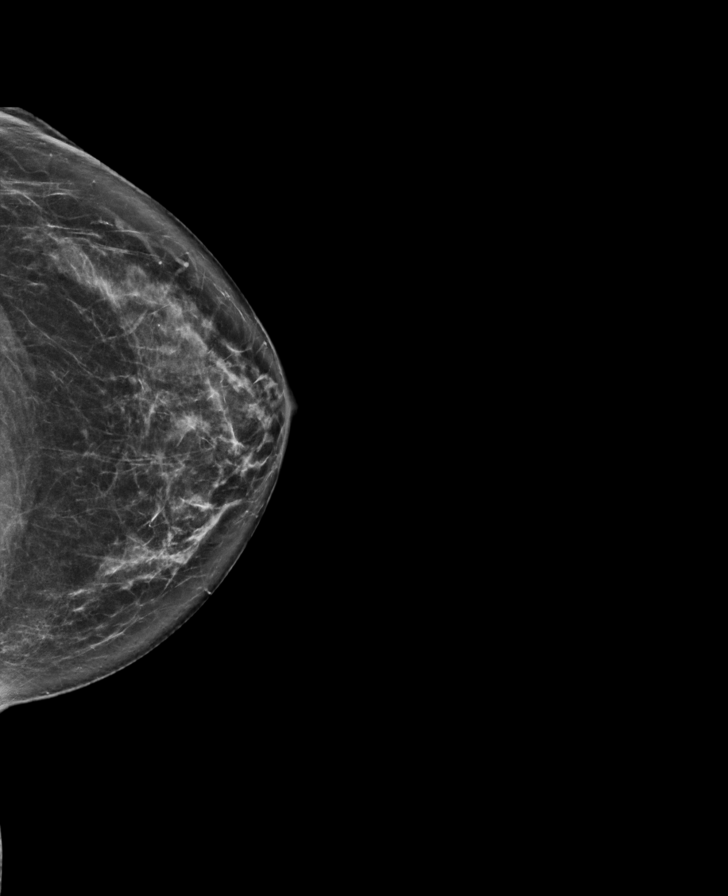

[L MLO synth-2D (1 of 2)]
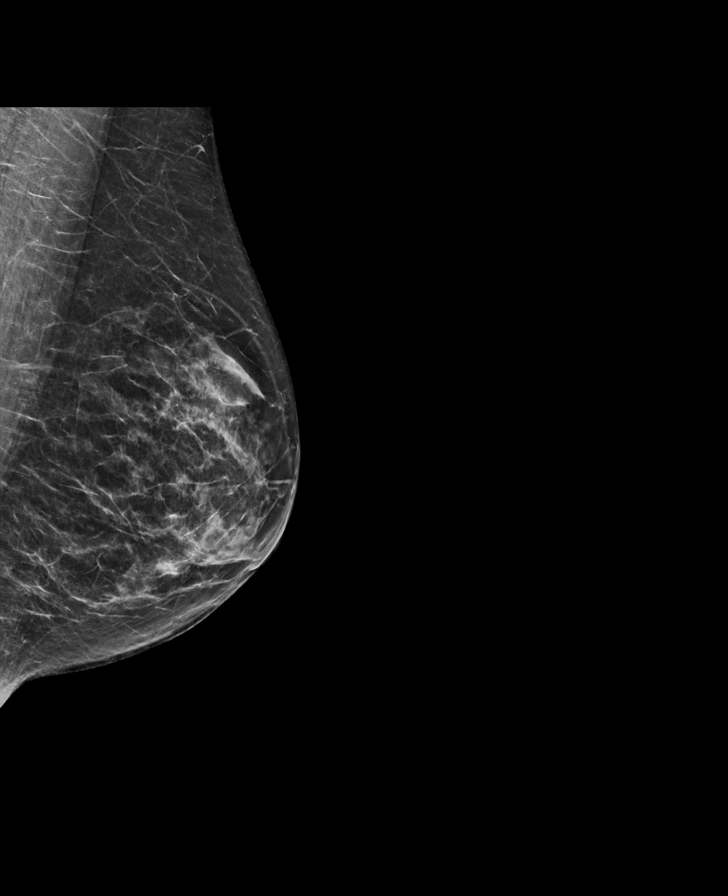

[L MLO synth-2D (2 of 2)]
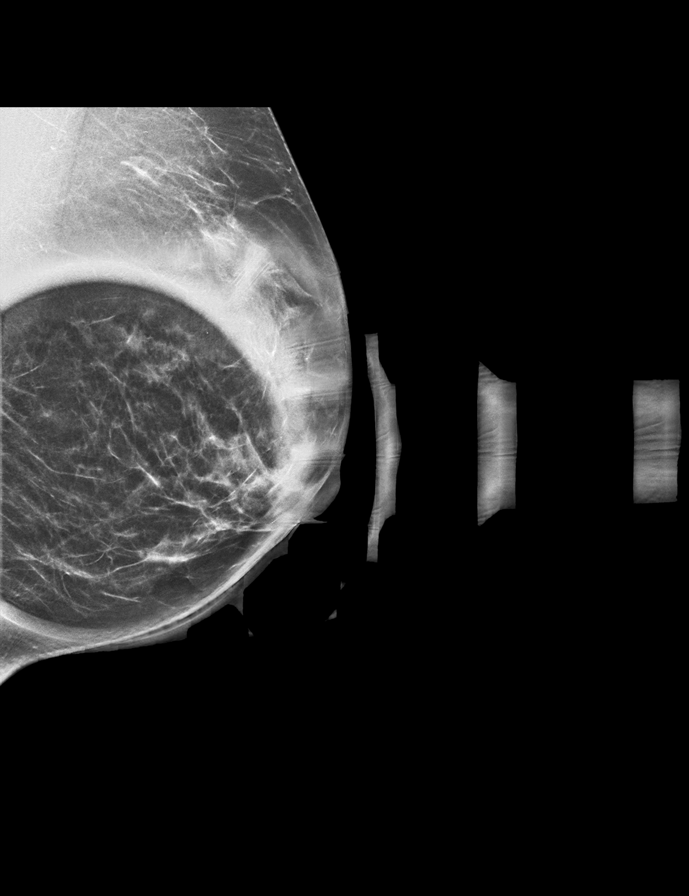

[L CC synth-2D (2 of 2)]
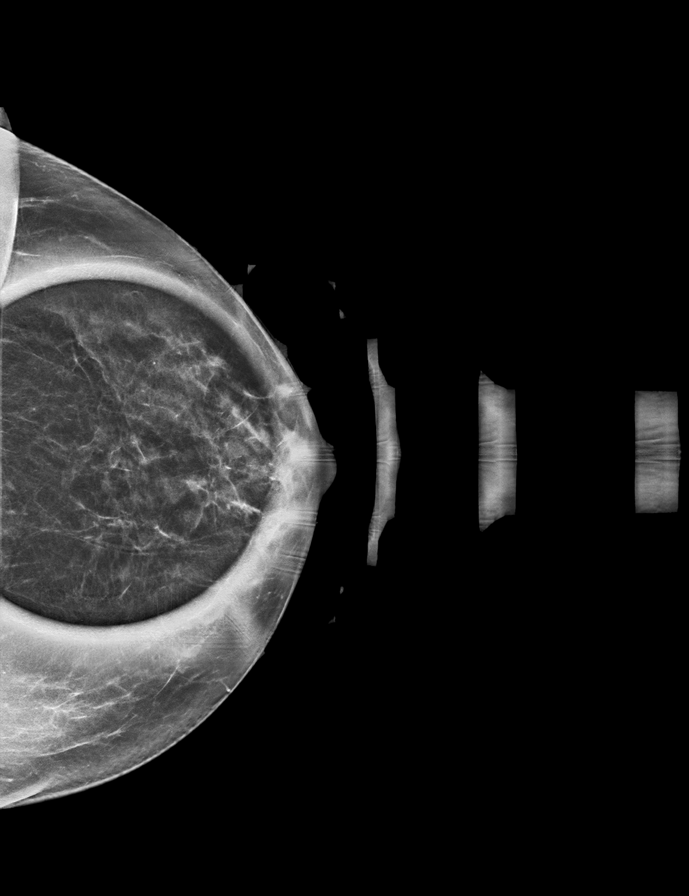

[L CC tomo (1 of 2) · tomo slice 26/51.0]
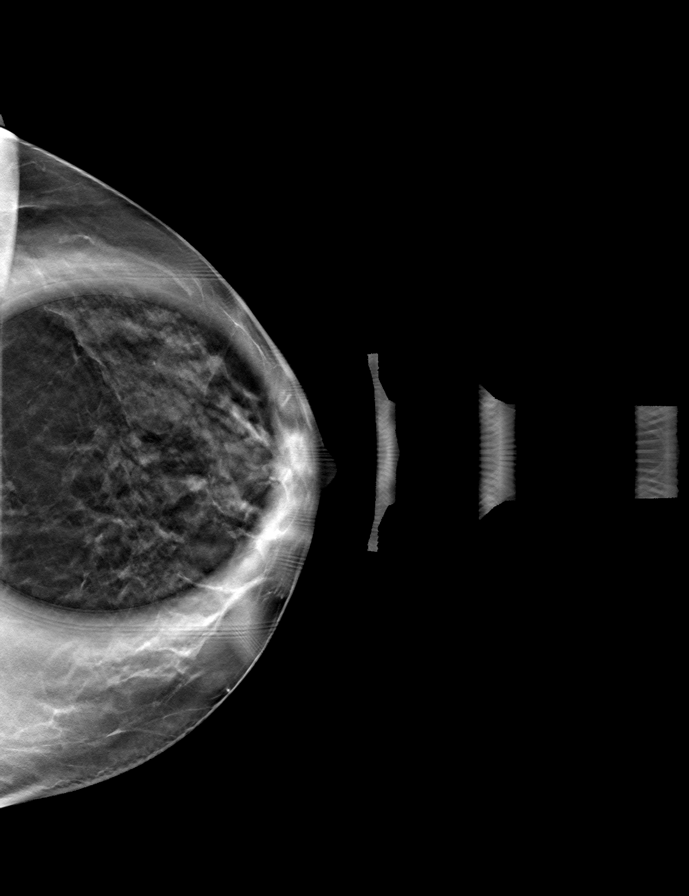

[L MLO tomo (1 of 2) · tomo slice 31/62.0]
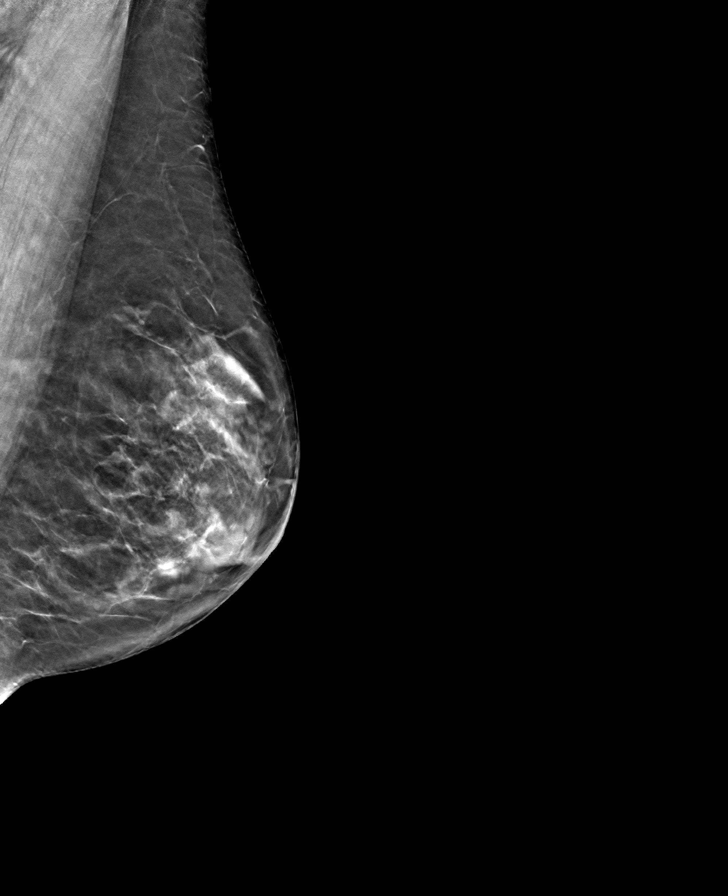

[L MLO tomo (2 of 2) · tomo slice 25/50.0]
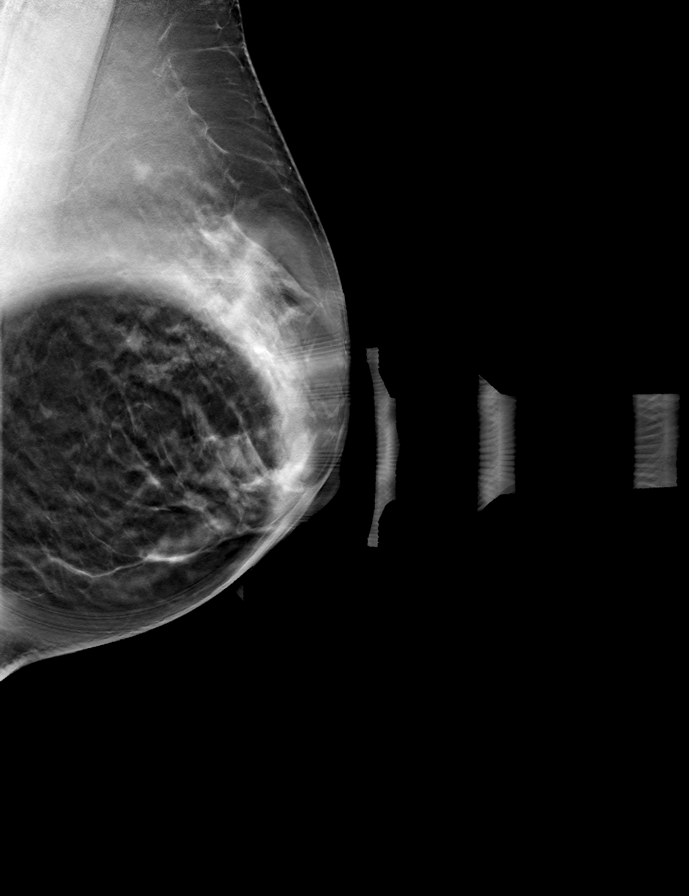

[L CC tomo (2 of 2) · tomo slice 34/67.0]
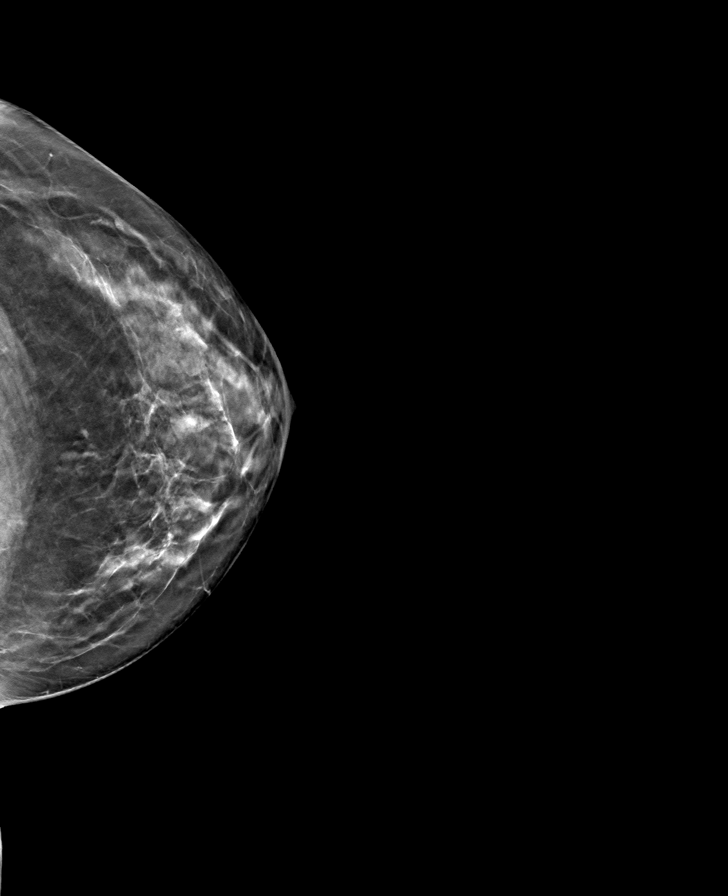

[8 of 24 positions shown; findings below may reference images not displayed]

ACR Breast Density Category c: The breast tissue is heterogeneously
dense, which may obscure small masses.
FINDINGS: Tomosynthesis and synthesized full field CC and MLO views of the
LEFT breast were obtained. Tomosynthesis and synthesized spot
compression CC and MLO views a mammographic finding were also
obtained.

A possible focal asymmetry in the LOWER retroareolar breast at
ANTERIOR to MIDDLE depth disperses with compression, indicating
overlapping fibroglandular tissue, and there is no underlying mass
or architectural distortion.

No findings suspicious for malignancy in the LEFT breast.

Mammographic images were processed with CAD.

On correlative physical exam, there is a palpable ridge of tissue in
the LOWER INNER LEFT breast in the area of concern, though I do not
palpate a discrete firm fixed mass.

Targeted LEFT breast ultrasound is performed, showing normal
scattered fibroglandular tissue in the LOWER OUTER QUADRANT. No
cyst, solid mass or abnormal acoustic shadowing is identified.
IMPRESSION: No mammographic or sonographic evidence of malignancy involving the
LEFT breast.

RECOMMENDATION:
Annual BILATERAL screening mammography which is due in January 2021.

I have discussed the findings and recommendations with the patient.
If applicable, a reminder letter will be sent to the patient
regarding the next appointment.

BI-RADS CATEGORY  1: Negative.

## 2022-04-30 NOTE — Progress Notes (Unsigned)
ACUTE VISIT No chief complaint on file.  HPI: [name] is a 66 y.o. female, who is here today complaining of *** HPI  Review of Systems Rest see pertinent positives and negatives per HPI.  Current Outpatient Medications on File Prior to Visit  Medication Sig Dispense Refill  . BIOTIN PO Take 5,000 mcg by mouth daily.    . Calcium-Vitamin D-Vitamin K (VIACTIV PO) Take 2 tablets by mouth daily.    . fluticasone (FLONASE) 50 MCG/ACT nasal spray 2 sprays daily.    Marland Kitchen ibuprofen (ADVIL) 800 MG tablet Take 1 tablet (800 mg total) by mouth every 8 (eight) hours as needed for moderate pain. For AFTER surgery 30 tablet 1  . melatonin 5 MG TABS Take 5 mg by mouth at bedtime.    . senna-docusate (SENOKOT-S) 8.6-50 MG tablet Take 2 tablets by mouth at bedtime. For AFTER surgery, do not take if having loose stools/diarrhea (Patient not taking: Reported on 11/20/2021) 30 tablet 0  . VITAMIN D PO Take 1,000 Units by mouth daily.     No current facility-administered medications on file prior to visit.     Past Medical History:  Diagnosis Date  . Anemia    history of prior to hysterectomy  . Endometriosis   . Fibroid   . History of kidney stones   . Osteopenia 07/07/2013   spine only -1.2  . Scabies    Allergies  Allergen Reactions  . Anectine [Succinylcholine Chloride]     Has trouble coming out of this anesthesia  . Amoxicillin-Pot Clavulanate Rash    Social History   Socioeconomic History  . Marital status: Married    Spouse name: Not on file  . Number of children: Not on file  . Years of education: Not on file  . Highest education level: Not on file  Occupational History  . Not on file  Tobacco Use  . Smoking status: Never  . Smokeless tobacco: Never  Vaping Use  . Vaping Use: Never used  Substance and Sexual Activity  . Alcohol use: Yes    Alcohol/week: 2.0 - 3.0 standard drinks of alcohol    Types: 2 - 3 Standard drinks or equivalent per week  . Drug  use: No  . Sexual activity: Not Currently    Partners: Male    Birth control/protection: Surgical    Comment: LAVH  Other Topics Concern  . Not on file  Social History Narrative  . Not on file   Social Determinants of Health   Financial Resource Strain: Low Risk  (11/20/2021)   Overall Financial Resource Strain (CARDIA)   . Difficulty of Paying Living Expenses: Not hard at all  Food Insecurity: No Food Insecurity (11/20/2021)   Hunger Vital Sign   . Worried About Charity fundraiser in the Last Year: Never true   . Ran Out of Food in the Last Year: Never true  Transportation Needs: No Transportation Needs (11/20/2021)   PRAPARE - Transportation   . Lack of Transportation (Medical): No   . Lack of Transportation (Non-Medical): No  Physical Activity: Sufficiently Active (11/20/2021)   Exercise Vital Sign   . Days of Exercise per Week: 5 days   . Minutes of Exercise per Session: 90 min  Stress: No Stress Concern Present (11/20/2021)   [place]   . Feeling of Stress : Not at all  Social Connections: Not on file    There were no  vitals filed for this visit. There is no height or weight on file to calculate BMI.  Physical Exam  ASSESSMENT AND PLAN:  There are no diagnoses linked to this encounter.   No follow-ups on file.   Helayne Metsker G. [place], MD  [facility]. [place] office.  Discharge Instructions   None

## 2022-05-01 ENCOUNTER — Ambulatory Visit (INDEPENDENT_AMBULATORY_CARE_PROVIDER_SITE_OTHER): Payer: Medicare Other | Admitting: Family Medicine

## 2022-05-01 ENCOUNTER — Encounter: Payer: Self-pay | Admitting: Family Medicine

## 2022-05-01 VITALS — BP 110/70 | HR 67 | Temp 98.5°F | Resp 16 | Ht 67.0 in | Wt 150.1 lb

## 2022-05-01 DIAGNOSIS — R21 Rash and other nonspecific skin eruption: Secondary | ICD-10-CM

## 2022-05-01 DIAGNOSIS — W57XXXA Bitten or stung by nonvenomous insect and other nonvenomous arthropods, initial encounter: Secondary | ICD-10-CM

## 2022-05-01 DIAGNOSIS — S40869A Insect bite (nonvenomous) of unspecified upper arm, initial encounter: Secondary | ICD-10-CM

## 2022-05-01 NOTE — Patient Instructions (Addendum)
A few things to remember from today's visit:  Insect bite of upper arm, unspecified laterality, initial encounter ? Aunt or spider bite, very localized and improving. Continue over the counter topical steroid and monitor for new symptoms. Keep areas clean with soap and water.  If you need refills for medications you take chronically, please call your pharmacy. Do not use My Chart to request refills or for acute issues that need immediate attention. If you send a my chart message, it may take a few days to be addressed, specially if I am not in the office.  Please be sure medication list is accurate. If a new problem present, please set up appointment sooner than planned today.

## 2022-05-21 NOTE — Progress Notes (Signed)
66 y.o. G73P1001 Married White or Caucasian Not Hispanic or Latino female here for annual exam.  No vaginal bleeding. Not sexually active.    04/06/2021: Robotic BSO in the setting of complex pelvic mass and elevated CA-125.  Final pathology revealed a serous cystoadenofibroma of the right ovary.  No malignancy seen. Surgery was done by Dr Berline Lopes.    Prior h/o supracervical hysterectomy.   H/O asymptomatic cystocele and rectocele, no changes. No bowel or bladder c/o.  Patient's last menstrual period was 12/31/1999.          Sexually active: No.  The current method of family planning is status post hysterectomy.    Exercising: Yes.     Walking, yoga.  Smoker:  no  Health Maintenance: Pap:   09/10/18 negative, negative HPV; 06-19-16 neg (supracervical hysterectomy)  History of abnormal Pap:  no MMG:  06/12/21 Bi-rads 3 probably benign U/S Right axilla same results.  BMD:   11/01/21 osteopenic T-score -2.3, FRAX 7.2/0.4% Colonoscopy: 06/19/2016 f/u 10 years  TDaP:  2017  Gardasil: n/a   reports that she has never smoked. She has never used smokeless tobacco. She reports current alcohol use of about 2.0 - 3.0 standard drinks of alcohol per week. She reports that she does not use drugs. Retired, Psychologist, occupational work. 2 daughters (one adopted). No grandchildren.   Past Medical History:  Diagnosis Date   Anemia    history of prior to hysterectomy   Endometriosis    Fibroid    History of kidney stones    Osteopenia 07/07/2013   spine only -1.2   Scabies     Past Surgical History:  Procedure Laterality Date   EYE SURGERY     laser procedure in office   hysteroscopic resection  10/1998   ROBOTIC ASSISTED BILATERAL SALPINGO OOPHERECTOMY Bilateral 04/06/2021   Procedure: XI ROBOTIC ASSISTED BILATERAL SALPINGO OOPHORECTOMY;  Surgeon: Lafonda Mosses, MD;  Location: [facility];  Service: Gynecology;  Laterality: Bilateral;   SUPRACERVICAL ABDOMINAL HYSTERECTOMY  2001   Supracervical laparoscopic  hysterectomy, retained ovaries   VARICOSE VEIN SURGERY  2011,2012,2013    Current Outpatient Medications  Medication Sig Dispense Refill   BIOTIN PO Take 5,000 mcg by mouth daily.     Calcium-Vitamin D-Vitamin K (VIACTIV PO) Take 2 tablets by mouth daily.     fluticasone (FLONASE) 50 MCG/ACT nasal spray 2 sprays daily.     ibuprofen (ADVIL) 800 MG tablet Take 1 tablet (800 mg total) by mouth every 8 (eight) hours as needed for moderate pain. For AFTER surgery 30 tablet 1   melatonin 5 MG TABS Take 5 mg by mouth at bedtime.     VITAMIN D PO Take 1,000 Units by mouth daily.     No current facility-administered medications for this visit.    Family History  Problem Relation Age of Onset   Hypertension Mother    Heart attack Mother        pacemaker   Heart disease Father    Stroke Father    Cancer Father        prostate   Cancer Maternal Grandfather        liver   Colon cancer Neg Hx    Breast cancer Neg Hx    Ovarian cancer Neg Hx    Endometrial cancer Neg Hx    Pancreatic cancer Neg Hx     Review of Systems  All other systems reviewed and are negative.   Exam:   BP 102/72  Pulse 62   Ht 5' 6.75" (1.695 m)   Wt 150 lb (68 kg)   LMP 12/31/1999   SpO2 96%   BMI 23.67 kg/m   Weight change: '@WEIGHTCHANGE'$ @ Height:   Height: 5' 6.75" (169.5 cm)  Ht Readings from Last 3 Encounters:  05/30/22 5' 6.75" (1.695 m)  05/01/22 '5\' 7"'$  (1.702 m)  11/20/21 '5\' 7"'$  (1.702 m)    General appearance: alert, cooperative and appears stated age Head: Normocephalic, without obvious abnormality, atraumatic Neck: no adenopathy, supple, symmetrical, trachea midline and thyroid normal to inspection and palpation Lungs: clear to auscultation bilaterally Cardiovascular: regular rate and rhythm Breasts: normal appearance, no masses or tenderness Abdomen: soft, non-tender; non distended,  no masses,  no organomegaly Extremities: extremities normal, atraumatic, no cyanosis or edema Skin: Skin  color, texture, turgor normal. No rashes or lesions Lymph nodes: Cervical, supraclavicular, and axillary nodes normal. No abnormal inguinal nodes palpated Neurologic: Grossly normal   Pelvic: External genitalia:  no lesions              Urethra:  normal appearing urethra with no masses, tenderness or lesions              Bartholins and Skenes: normal                 Vagina: mildly atrophic appearing vagina with a small grade 2 cystocele, grade 1-2 cervical prolapse and grade 1 rectocele              Cervix: no lesions               Bimanual Exam:  Uterus:  uterus absent              Adnexa: no mass, fullness, tenderness               Rectovaginal: Confirms               Anus:  normal sphincter tone, no lesions  Katherine Kerr, CMA chaperoned for the exam.  1. Encounter for breast and pelvic examination Discussed breast self exam Mammogram scheduled Colonoscopy UTD Labs with primary  2. History of osteopenia Discussed calcium and vit D intake Discussed exercise, working on balance and avoiding falls Next DEXA is due in 5/25  3. Female genital prolapse, unspecified type Not symptomatic, reach out with concerns

## 2022-05-30 ENCOUNTER — Ambulatory Visit (INDEPENDENT_AMBULATORY_CARE_PROVIDER_SITE_OTHER): Payer: Medicare Other | Admitting: Obstetrics and Gynecology

## 2022-05-30 ENCOUNTER — Encounter: Payer: Self-pay | Admitting: Obstetrics and Gynecology

## 2022-05-30 ENCOUNTER — Other Ambulatory Visit (HOSPITAL_COMMUNITY)
Admission: RE | Admit: 2022-05-30 | Discharge: 2022-05-30 | Disposition: A | Discharge status: Home / Self Care | Payer: Medicare Other | Source: Ambulatory Visit | Attending: Obstetrics and Gynecology | Admitting: Obstetrics and Gynecology

## 2022-05-30 VITALS — BP 102/72 | HR 62 | Ht 66.75 in | Wt 150.0 lb

## 2022-05-30 DIAGNOSIS — Z1151 Encounter for screening for human papillomavirus (HPV): Secondary | ICD-10-CM | POA: Insufficient documentation

## 2022-05-30 DIAGNOSIS — Z8739 Personal history of other diseases of the musculoskeletal system and connective tissue: Secondary | ICD-10-CM

## 2022-05-30 DIAGNOSIS — Z1211 Encounter for screening for malignant neoplasm of colon: Secondary | ICD-10-CM | POA: Insufficient documentation

## 2022-05-30 DIAGNOSIS — Z124 Encounter for screening for malignant neoplasm of cervix: Secondary | ICD-10-CM

## 2022-05-30 DIAGNOSIS — Z01419 Encounter for gynecological examination (general) (routine) without abnormal findings: Secondary | ICD-10-CM

## 2022-05-30 DIAGNOSIS — N819 Female genital prolapse, unspecified: Secondary | ICD-10-CM | POA: Diagnosis not present

## 2022-06-06 LAB — CYTOLOGY - PAP
Comment: NEGATIVE
Diagnosis: HIGH — AB
High risk HPV: NEGATIVE

## 2022-06-07 ENCOUNTER — Other Ambulatory Visit: Payer: Self-pay | Admitting: *Deleted

## 2022-06-07 DIAGNOSIS — R87611 Atypical squamous cells cannot exclude high grade squamous intraepithelial lesion on cytologic smear of cervix (ASC-H): Secondary | ICD-10-CM

## 2022-06-14 ENCOUNTER — Ambulatory Visit
Admission: RE | Admit: 2022-06-14 | Discharge: 2022-06-14 | Disposition: A | Discharge status: Home / Self Care | Payer: Medicare Other | Source: Ambulatory Visit | Attending: Family Medicine | Admitting: Family Medicine

## 2022-06-14 DIAGNOSIS — N6310 Unspecified lump in the right breast, unspecified quadrant: Secondary | ICD-10-CM

## 2022-06-14 DIAGNOSIS — R928 Other abnormal and inconclusive findings on diagnostic imaging of breast: Secondary | ICD-10-CM

## 2022-06-18 ENCOUNTER — Telehealth: Payer: Self-pay | Admitting: Family Medicine

## 2022-06-18 NOTE — Telephone Encounter (Signed)
Inform patient of mammogram result.

## 2022-06-18 NOTE — Telephone Encounter (Signed)
Pt is calling back regarding her mammogram results

## 2022-07-19 ENCOUNTER — Ambulatory Visit: Payer: Medicare Other | Admitting: Obstetrics and Gynecology

## 2022-08-06 ENCOUNTER — Encounter: Payer: Self-pay | Admitting: Obstetrics and Gynecology

## 2022-08-06 NOTE — Telephone Encounter (Signed)
Katherine Kerr w/ pt on the phone and went through the colpo procedure with her step by step. Advised her to take her choice of NSAID 48mns-1hr prior to procedure to help w/ any discomfort she may experience.  Advised her of some advice on what she may expect w/ after care if monsels were needed to be used to help w/ any bleeding.  Pt voiced understanding.

## 2022-08-07 ENCOUNTER — Other Ambulatory Visit (HOSPITAL_COMMUNITY)
Admission: RE | Admit: 2022-08-07 | Discharge: 2022-08-07 | Disposition: A | Discharge status: Home / Self Care | Payer: Medicare Other | Source: Ambulatory Visit | Attending: Obstetrics and Gynecology | Admitting: Obstetrics and Gynecology

## 2022-08-07 ENCOUNTER — Ambulatory Visit (INDEPENDENT_AMBULATORY_CARE_PROVIDER_SITE_OTHER): Payer: Medicare Other | Admitting: Obstetrics and Gynecology

## 2022-08-07 ENCOUNTER — Encounter: Payer: Self-pay | Admitting: Obstetrics and Gynecology

## 2022-08-07 VITALS — BP 110/68 | HR 66 | Wt 151.0 lb

## 2022-08-07 DIAGNOSIS — R87611 Atypical squamous cells cannot exclude high grade squamous intraepithelial lesion on cytologic smear of cervix (ASC-H): Secondary | ICD-10-CM | POA: Diagnosis present

## 2022-08-07 NOTE — Progress Notes (Signed)
GYNECOLOGY  VISIT   HPI: 67 y.o.   Married White or Caucasian Not Hispanic or Latino  female   [id number] with Patient's last menstrual period was 12/31/1999.   here for evaluation of an ASC-H pap with negative HPV testing. H/O supracervical hysterectomy.   GYNECOLOGIC HISTORY: Patient's last menstrual period was 12/31/1999. Contraception:PMP/SCH Menopausal hormone therapy: none        OB History     Gravida  1   Para  1   Term  1   Preterm      AB      Living  1      SAB      IAB      Ectopic      Multiple      Live Births  1        Obstetric Comments  1 adopted            Patient Active Problem List   Diagnosis Date Noted   Colon cancer screening 05/30/2022   Eustachian tube dysfunction, bilateral 01/10/2017    Past Medical History:  Diagnosis Date   Anemia    history of prior to hysterectomy   Endometriosis    Fibroid    History of kidney stones    Osteopenia 07/07/2013   spine only -1.2   Scabies     Past Surgical History:  Procedure Laterality Date   EYE SURGERY     laser procedure in office   hysteroscopic resection  10/1998   ROBOTIC ASSISTED BILATERAL SALPINGO OOPHERECTOMY Bilateral 04/06/2021   Procedure: XI ROBOTIC ASSISTED BILATERAL SALPINGO OOPHORECTOMY;  Surgeon: Lafonda Mosses, MD;  Location: [facility];  Service: Gynecology;  Laterality: Bilateral;   SUPRACERVICAL ABDOMINAL HYSTERECTOMY  2001   Supracervical laparoscopic hysterectomy, retained ovaries   VARICOSE VEIN SURGERY  2011,2012,2013    Current Outpatient Medications  Medication Sig Dispense Refill   BIOTIN PO Take 5,000 mcg by mouth daily.     Calcium-Vitamin D-Vitamin K (VIACTIV PO) Take 2 tablets by mouth daily.     fluticasone (FLONASE) 50 MCG/ACT nasal spray 2 sprays daily.     ibuprofen (ADVIL) 800 MG tablet Take 1 tablet (800 mg total) by mouth every 8 (eight) hours as needed for moderate pain. For AFTER surgery 30 tablet 1   melatonin 5 MG TABS Take 5 mg by  mouth at bedtime.     VITAMIN D PO Take 1,000 Units by mouth daily.     No current facility-administered medications for this visit.     ALLERGIES: Anectine [succinylcholine chloride] and Amoxicillin-pot clavulanate  Family History  Problem Relation Age of Onset   Hypertension Mother    Heart attack Mother        pacemaker   Heart disease Father    Stroke Father    Cancer Father        prostate   Cancer Maternal Grandfather        liver   Colon cancer Neg Hx    Breast cancer Neg Hx    Ovarian cancer Neg Hx    Endometrial cancer Neg Hx    Pancreatic cancer Neg Hx     Social History   Socioeconomic History   Marital status: Married    Spouse name: Not on file   Number of children: Not on file   Years of education: Not on file   Highest education level: Not on file  Occupational History   Not on file  Tobacco Use  Smoking status: Never   Smokeless tobacco: Never  Vaping Use   Vaping Use: Never used  Substance and Sexual Activity   Alcohol use: Yes    Alcohol/week: 2.0 - 3.0 standard drinks of alcohol    Types: 2 - 3 Standard drinks or equivalent per week    Comment: occassional/social   Drug use: No   Sexual activity: Not Currently    Partners: Male    Birth control/protection: Surgical    Comment: LAVH, First IC >65 y/o, Partners <5, No hx STIs, DES-neg  Other Topics Concern   Not on file  Social History Narrative   Not on file   Social Determinants of Health   Financial Resource Strain: Low Risk  (11/20/2021)   Overall Financial Resource Strain (CARDIA)    Difficulty of Paying Living Expenses: Not hard at all  Food Insecurity: No Food Insecurity (11/20/2021)   Hunger Vital Sign    Worried About Running Out of Food in the Last Year: Never true    [place] in the Last Year: Never true  Transportation Needs: No Transportation Needs (11/20/2021)   PRAPARE - Hydrologist (Medical): No    Lack of Transportation  (Non-Medical): No  Physical Activity: Sufficiently Active (11/20/2021)   Exercise Vital Sign    Days of Exercise per Week: 5 days    Minutes of Exercise per Session: 90 min  Stress: No Stress Concern Present (11/20/2021)   [place]    Feeling of Stress : Not at all  Social Connections: Not on file  Intimate Partner Violence: Not on file    ROS  PHYSICAL EXAMINATION:    LMP 12/31/1999     General appearance: alert, cooperative and appears stated age   Pelvic: External genitalia:  no lesions              Urethra:  normal appearing urethra with no masses, tenderness or lesions              Bartholins and Skenes: normal                 Vagina: atropic appearing vagina with  with a small grade 2 cystocele, grade 1-2 cervical prolapse and grade 1 rectocele              Cervix: no lesions  Colposcopy: unsatisfactory, no aceto-white changes, fairly diffuse decrease uptake of lugols solution on the cervix, and vaginal walls. Lugols is taken up on the proximal cervix anteriorly and the vaginal apex anteriorly. Lateral cervical biopsy taken at 9 o'clock, ECC done.                Chaperone was present for exam.  1. Atypical squamous cells cannot exclude high grade squamous intraepithelial lesion on cytologic smear of cervix (ASC-H) - Surgical pathology( [facility]/ [facility])

## 2022-08-07 NOTE — Patient Instructions (Signed)

## 2022-08-11 ENCOUNTER — Encounter: Payer: Self-pay | Admitting: Obstetrics and Gynecology

## 2022-08-15 LAB — SURGICAL PATHOLOGY

## 2022-08-21 ENCOUNTER — Encounter: Payer: Self-pay | Admitting: Obstetrics and Gynecology

## 2022-08-21 NOTE — Telephone Encounter (Signed)
Per JJ result note from 08/07/2022: I contacted [facility] and spoke with Dr. Chauncey Cruel whom also conferred w/ Dr. Saralyn Pilar that the vaginal estrogen and 6 month f/u pap recommendation is indeed a reasonable course of action for pt.

## 2022-08-21 NOTE — Telephone Encounter (Signed)
Please let the patient know the recommendation of the pathologist. If she is agreeable, please call in estradiol 10 mcg, 1 tablet vaginally 2 x a week at hs. #24, 1 refill. She needs a repeat pap and hpv in 6 months.  In regards to her daughter, I think all GYN's do some level of counseling, but I don't know any with specific mental health training.

## 2022-08-22 ENCOUNTER — Other Ambulatory Visit: Payer: Self-pay

## 2022-08-22 MED ORDER — ESTRADIOL 10 MCG VA TABS: 1.0000 | ORAL_TABLET | VAGINAL | 1 refills | Status: DC

## 2022-08-22 NOTE — Telephone Encounter (Signed)
I spoke with patient and advised her of results and Dr. Jertson's/pathologist's recommendation. Also, info regarding her daughter was relayed  Rx for Estradiol 10 mcg tabs sent to her pharmacy.  Recall placed for 6 month repap /hpv.  Patient wants to call back to schedule this visit.

## 2022-11-15 ENCOUNTER — Telehealth: Payer: Self-pay | Admitting: Family Medicine

## 2022-11-15 NOTE — Telephone Encounter (Signed)
Called patient to schedule Medicare Annual Wellness Visit (AWV). Left message for patient to call back and schedule Medicare Annual Wellness Visit (AWV).  Last date of AWV: 11/20/21  Please schedule an appointment at any time with [facility] or hannah kim.  If any questions, please contact me at [phone].  Thank you ,  Jakerria Kingbird [facility] AWV direct phone # [phone] 

## 2022-11-15 NOTE — Telephone Encounter (Signed)
Contacted Katherine Kerr to schedule their annual wellness visit. Appointment made for 12/12/22.  Rudell Cobb AWV direct phone # [phone]

## 2022-12-03 ENCOUNTER — Ambulatory Visit (INDEPENDENT_AMBULATORY_CARE_PROVIDER_SITE_OTHER): Payer: Medicare Other | Admitting: Family Medicine

## 2022-12-03 VITALS — BP 110/70 | HR 64 | Temp 97.8°F | Resp 16 | Ht 66.5 in | Wt 148.4 lb

## 2022-12-03 DIAGNOSIS — H66002 Acute suppurative otitis media without spontaneous rupture of ear drum, left ear: Secondary | ICD-10-CM

## 2022-12-03 DIAGNOSIS — H6991 Unspecified Eustachian tube disorder, right ear: Secondary | ICD-10-CM

## 2022-12-03 MED ORDER — AZITHROMYCIN 250 MG PO TABS: ORAL_TABLET | ORAL | 0 refills | Status: DC

## 2022-12-03 NOTE — Patient Instructions (Signed)
Follow up as needed or as scheduled START the Zpack as directed- 2 today and then 1 daily Tylenol or ibuprofen as needed for pain Drink LOTS of fluids Take a daily OTC antihistamine like Claritin or Zyrtec to help w/ nasal congestion RESTART Flonase- 2 sprays each nostril daily- until ears are feeling better Call with any questions or concerns Hang in there!!

## 2022-12-03 NOTE — Progress Notes (Signed)
   Subjective:    Patient ID: Katherine Kerr, female    DOB: 27-Mar-1956, 67 y.o.   MRN: [id number]  HPI Ear pain- pt has a hx of ear pain when flying.  Had nasal congestion on Saturday and then flew.  Developed ear pain and pressure, L>R.  Took Mucinex and rested yesterday.  No fever.  Pt reports she gets a facial rash when sick and area gets warm and red- developed yesterday morning.  No drainage from ear.  + nasal congestion.  No tooth pain.   Review of Systems For ROS see HPI     Objective:   Physical Exam Vitals reviewed.  Constitutional:      General: She is not in acute distress.    Appearance: Normal appearance. She is not ill-appearing.  HENT:     Head: Normocephalic and atraumatic.     Right Ear: Tympanic membrane is retracted.     Left Ear: A middle ear effusion is present. Tympanic membrane is erythematous and bulging.     Nose: Congestion present. No rhinorrhea.     Mouth/Throat:     Mouth: Mucous membranes are moist.     Pharynx: No oropharyngeal exudate or posterior oropharyngeal erythema.  Cardiovascular:     Rate and Rhythm: Normal rate and regular rhythm.  Pulmonary:     Effort: Pulmonary effort is normal. No respiratory distress.  Musculoskeletal:     Cervical back: Normal range of motion.  Lymphadenopathy:     Cervical: No cervical adenopathy.  Skin:    General: Skin is warm and dry.  Neurological:     General: No focal deficit present.     Mental Status: She is alert and oriented to person, place, and time.  Psychiatric:        Mood and Affect: Mood normal.        Behavior: Behavior normal.        Thought Content: Thought content normal.           Assessment & Plan:   L OM- new.  Pt has erythematous, bulging TM.  Given her allergy to Augmentin, will start Zpack.  Pt expressed understanding and is in agreement w/ plan.   R eustachian tube dysfxn- new.  Pt has hx of similar.  Encouraged her to take daily OTC antihistamine and restart nasal  steroid.  Pt expressed understanding and is in agreement w/ plan.

## 2022-12-12 ENCOUNTER — Ambulatory Visit (INDEPENDENT_AMBULATORY_CARE_PROVIDER_SITE_OTHER): Payer: Medicare Other

## 2022-12-12 VITALS — Ht 67.0 in | Wt 148.0 lb

## 2022-12-12 DIAGNOSIS — Z0000 Encounter for general adult medical examination without abnormal findings: Secondary | ICD-10-CM | POA: Diagnosis not present

## 2022-12-12 NOTE — Patient Instructions (Addendum)
Katherine Kerr , Thank you for taking time to come for your Medicare Wellness Visit. I appreciate your ongoing commitment to your health goals. Please review the following plan we discussed and let me know if I can assist you in the future.   These are the goals we discussed:  Goals       Patient Stated      11/20/2021, no goals      Patient stated (pt-stated)        This is a list of the screening recommended for you and due dates:  Health Maintenance  Topic Date Due   COVID-19 Vaccine (7 - 2023-24 season) 12/19/2022*   Pneumonia Vaccine (1 of 1 - PCV) 12/03/2023*   Flu Shot  01/31/2023   Medicare Annual Wellness Visit  12/12/2023   Mammogram  06/14/2024   Colon Cancer Screening  08/01/2025   DTaP/Tdap/Td vaccine (3 - Td or Tdap) 06/19/2026   DEXA scan (bone density measurement)  Completed   Hepatitis C Screening  Completed   Zoster (Shingles) Vaccine  Completed   HPV Vaccine  Aged Out  *Topic was postponed. The date shown is not the original due date.    Advanced directives: Please bring a copy of your health care power of attorney and living will to the office to be added to your chart at your convenience.   Conditions/risks identified: None  Next appointment: Follow up in one year for your annual wellness visit    Preventive Care 65 Years and Older, Female Preventive care refers to lifestyle choices and visits with your health care provider that can promote health and wellness. What does preventive care include? A yearly physical exam. This is also called an annual well check. Dental exams once or twice a year. Routine eye exams. Ask your health care provider how often you should have your eyes checked. Personal lifestyle choices, including: Daily care of your teeth and gums. Regular physical activity. Eating a healthy diet. Avoiding tobacco and drug use. Limiting alcohol use. Practicing safe sex. Taking low-dose aspirin every day. Taking vitamin and mineral supplements  as recommended by your health care provider. What happens during an annual well check? The services and screenings done by your health care provider during your annual well check will depend on your age, overall health, lifestyle risk factors, and family history of disease. Counseling  Your health care provider may ask you questions about your: Alcohol use. Tobacco use. Drug use. Emotional well-being. Home and relationship well-being. Sexual activity. Eating habits. History of falls. Memory and ability to understand (cognition). Work and work Astronomer. Reproductive health. Screening  You may have the following tests or measurements: Height, weight, and BMI. Blood pressure. Lipid and cholesterol levels. These may be checked every 5 years, or more frequently if you are over 59 years old. Skin check. Lung cancer screening. You may have this screening every year starting at age 14 if you have a 30-pack-year history of smoking and currently smoke or have quit within the past 15 years. Fecal occult blood test (FOBT) of the stool. You may have this test every year starting at age 40. Flexible sigmoidoscopy or colonoscopy. You may have a sigmoidoscopy every 5 years or a colonoscopy every 10 years starting at age 19. Hepatitis C blood test. Hepatitis B blood test. Sexually transmitted disease (STD) testing. Diabetes screening. This is done by checking your blood sugar (glucose) after you have not eaten for a while (fasting). You may have this done every 1-3  years. Bone density scan. This is done to screen for osteoporosis. You may have this done starting at age 25. Mammogram. This may be done every 1-2 years. Talk to your health care provider about how often you should have regular mammograms. Talk with your health care provider about your test results, treatment options, and if necessary, the need for more tests. Vaccines  Your health care provider may recommend certain vaccines, such  as: Influenza vaccine. This is recommended every year. Tetanus, diphtheria, and acellular pertussis (Tdap, Td) vaccine. You may need a Td booster every 10 years. Zoster vaccine. You may need this after age 30. Pneumococcal 13-valent conjugate (PCV13) vaccine. One dose is recommended after age 73. Pneumococcal polysaccharide (PPSV23) vaccine. One dose is recommended after age 54. Talk to your health care provider about which screenings and vaccines you need and how often you need them. This information is not intended to replace advice given to you by your health care provider. Make sure you discuss any questions you have with your health care provider. Document Released: 07/15/2015 Document Revised: 03/07/2016 Document Reviewed: 04/19/2015 Elsevier Interactive Patient Education  2017 [place] Prevention in the Home Falls can cause injuries. They can happen to people of all ages. There are many things you can do to make your home safe and to help prevent falls. What can I do on the outside of my home? Regularly fix the edges of walkways and driveways and fix any cracks. Remove anything that might make you trip as you walk through a door, such as a raised step or threshold. Trim any bushes or trees on the path to your home. Use bright outdoor lighting. Clear any walking paths of anything that might make someone trip, such as rocks or tools. Regularly check to see if handrails are loose or broken. Make sure that both sides of any steps have handrails. Any raised decks and porches should have guardrails on the edges. Have any leaves, snow, or ice cleared regularly. Use sand or salt on walking paths during winter. Clean up any spills in your garage right away. This includes oil or grease spills. What can I do in the bathroom? Use night lights. Install grab bars by the toilet and in the tub and shower. Do not use towel bars as grab bars. Use non-skid mats or decals in the tub or  shower. If you need to sit down in the shower, use a plastic, non-slip stool. Keep the floor dry. Clean up any water that spills on the floor as soon as it happens. Remove soap buildup in the tub or shower regularly. Attach bath mats securely with double-sided non-slip rug tape. Do not have throw rugs and other things on the floor that can make you trip. What can I do in the bedroom? Use night lights. Make sure that you have a light by your bed that is easy to reach. Do not use any sheets or blankets that are too big for your bed. They should not hang down onto the floor. Have a firm chair that has side arms. You can use this for support while you get dressed. Do not have throw rugs and other things on the floor that can make you trip. What can I do in the kitchen? Clean up any spills right away. Avoid walking on wet floors. Keep items that you use a lot in easy-to-reach places. If you need to reach something above you, use a strong step stool that has a grab  bar. Keep electrical cords out of the way. Do not use floor polish or wax that makes floors slippery. If you must use wax, use non-skid floor wax. Do not have throw rugs and other things on the floor that can make you trip. What can I do with my stairs? Do not leave any items on the stairs. Make sure that there are handrails on both sides of the stairs and use them. Fix handrails that are broken or loose. Make sure that handrails are as long as the stairways. Check any carpeting to make sure that it is firmly attached to the stairs. Fix any carpet that is loose or worn. Avoid having throw rugs at the top or bottom of the stairs. If you do have throw rugs, attach them to the floor with carpet tape. Make sure that you have a light switch at the top of the stairs and the bottom of the stairs. If you do not have them, ask someone to add them for you. What else can I do to help prevent falls? Wear shoes that: Do not have high heels. Have  rubber bottoms. Are comfortable and fit you well. Are closed at the toe. Do not wear sandals. If you use a stepladder: Make sure that it is fully opened. Do not climb a closed stepladder. Make sure that both sides of the stepladder are locked into place. Ask someone to hold it for you, if possible. Clearly mark and make sure that you can see: Any grab bars or handrails. First and last steps. Where the edge of each step is. Use tools that help you move around (mobility aids) if they are needed. These include: Canes. Walkers. Scooters. Crutches. Turn on the lights when you go into a dark area. Replace any light bulbs as soon as they burn out. Set up your furniture so you have a clear path. Avoid moving your furniture around. If any of your floors are uneven, fix them. If there are any pets around you, be aware of where they are. Review your medicines with your doctor. Some medicines can make you feel dizzy. This can increase your chance of falling. Ask your doctor what other things that you can do to help prevent falls. This information is not intended to replace advice given to you by your health care provider. Make sure you discuss any questions you have with your health care provider. Document Released: 04/14/2009 Document Revised: 11/24/2015 Document Reviewed: 07/23/2014 Elsevier Interactive Patient Education  2017 [employer].

## 2022-12-12 NOTE — Progress Notes (Signed)
Subjective:   Katherine Kerr is a 67 y.o. female who presents for Medicare Annual (Subsequent) preventive examination.  Review of Systems    Virtual Visit via Telephone Note  I connected with  Katherine Kerr on 12/12/22 at 11:00 AM EDT by telephone and verified that I am speaking with the correct person using two identifiers.  Location: Patient: Home Provider: Office Persons participating in the virtual visit: patient/Nurse Health Advisor   I discussed the limitations, risks, security and privacy concerns of performing an evaluation and management service by telephone and the availability of in person appointments. The patient expressed understanding and agreed to proceed.  Interactive audio and video telecommunications were attempted between this nurse and patient, however failed, due to patient having technical difficulties OR patient did not have access to video capability.  We continued and completed visit with audio only.  Some vital signs may be absent or patient reported.   [name], LPN  Cardiac Risk Factors include: advanced age (>47men, >32 women);Other (see comment), Risk factor comments: Dx: Colon CA     Objective:    Today's Vitals   12/12/22 1102  Weight: 148 lb (67.1 kg)  Height: 5\' 7"  (1.702 m)   Body mass index is 23.18 kg/m.     12/12/2022   11:10 AM 11/20/2021    1:30 PM 04/24/2021   11:30 AM 04/05/2021    3:19 PM 03/30/2021    2:06 PM  Advanced Directives  Does Patient Have a Medical Advance Directive? Yes Yes Yes Yes Yes  Type of Estate agent of [place];Living will Healthcare Power of [place];Living will Living will Living will   Does patient want to make changes to medical advance directive?     No - Patient declined  Copy of Healthcare Power of Attorney in Chart? No - copy requested No - copy requested       Current Medications (verified) Outpatient Encounter Medications as of 12/12/2022  Medication Sig    azithromycin (ZITHROMAX) 250 MG tablet 2 tabs on day 1, 1 tab on day 2-5   BIOTIN PO Take 5,000 mcg by mouth daily.   Calcium-Vitamin D-Vitamin K (VIACTIV PO) Take 2 tablets by mouth daily.   Estradiol 10 MCG TABS vaginal tablet Place 1 tablet (10 mcg total) vaginally 2 (two) times a week.   fluticasone (FLONASE) 50 MCG/ACT nasal spray 2 sprays daily.   melatonin 5 MG TABS Take 5 mg by mouth at bedtime.   VITAMIN D PO Take 1,000 Units by mouth daily.   No facility-administered encounter medications on file as of 12/12/2022.    Allergies (verified) Anectine [succinylcholine chloride] and Amoxicillin-pot clavulanate   History: Past Medical History:  Diagnosis Date   Anemia    history of prior to hysterectomy   Endometriosis    Fibroid    History of kidney stones    Osteopenia 07/07/2013   spine only -1.2   Scabies    Past Surgical History:  Procedure Laterality Date   EYE SURGERY     laser procedure in office   hysteroscopic resection  10/1998   ROBOTIC ASSISTED BILATERAL SALPINGO OOPHERECTOMY Bilateral 04/06/2021   Procedure: XI ROBOTIC ASSISTED BILATERAL SALPINGO OOPHORECTOMY;  Surgeon: Carver Fila, MD;  Location: [facility];  Service: Gynecology;  Laterality: Bilateral;   SUPRACERVICAL ABDOMINAL HYSTERECTOMY  2001   Supracervical laparoscopic hysterectomy, retained ovaries   VARICOSE VEIN SURGERY  2011,2012,2013   Family History  Problem Relation Age of Onset  Hypertension Mother    Heart attack Mother        pacemaker   Heart disease Father    Stroke Father    Cancer Father        prostate   Cancer Maternal Grandfather        liver   Colon cancer Neg Hx    Breast cancer Neg Hx    Ovarian cancer Neg Hx    Endometrial cancer Neg Hx    Pancreatic cancer Neg Hx    Social History   Socioeconomic History   Marital status: Married    Spouse name: Not on file   Number of children: Not on file   Years of education: Not on file   Highest education level:  Bachelor's degree (e.g., BA, AB, BS)  Occupational History   Not on file  Tobacco Use   Smoking status: Never   Smokeless tobacco: Never  Vaping Use   Vaping Use: Never used  Substance and Sexual Activity   Alcohol use: Yes    Alcohol/week: 2.0 - 3.0 standard drinks of alcohol    Types: 2 - 3 Standard drinks or equivalent per week    Comment: occassional/social   Drug use: No   Sexual activity: Not Currently    Partners: Male    Birth control/protection: Surgical    Comment: LAVH, First IC >11 y/o, Partners <5, No hx STIs, DES-neg  Other Topics Concern   Not on file  Social History Narrative   Not on file   Social Determinants of Health   Financial Resource Strain: Low Risk  (12/12/2022)   Overall Financial Resource Strain (CARDIA)    Difficulty of Paying Living Expenses: Not hard at all  Food Insecurity: No Food Insecurity (12/12/2022)   Hunger Vital Sign    Worried About Running Out of Food in the Last Year: Never true    Ran Out of Food in the Last Year: Never true  Transportation Needs: No Transportation Needs (12/12/2022)   PRAPARE - Administrator, Civil Service (Medical): No    Lack of Transportation (Non-Medical): No  Physical Activity: Sufficiently Active (12/12/2022)   Exercise Vital Sign    Days of Exercise per Week: 5 days    Minutes of Exercise per Session: 60 min  Stress: No Stress Concern Present (12/12/2022)   [employer] of Occupational Health - Occupational Stress Questionnaire    Feeling of Stress : Not at all  Social Connections: Socially Integrated (12/12/2022)   Social Connection and Isolation Panel [NHANES]    Frequency of Communication with Friends and Family: More than three times a week    Frequency of Social Gatherings with Friends and Family: More than three times a week    Attends Religious Services: More than 4 times per year    Active Member of [employer] or Organizations: Yes    Attends Engineer, structural: More than 4  times per year    Marital Status: Married    Tobacco Counseling Counseling given: Not Answered   Clinical Intake:  Pre-visit preparation completed: No  Pain : No/denies pain     BMI - recorded: 23.18 Nutritional Status: BMI of 19-24  Normal Nutritional Risks: None Diabetes: No  How often do you need to have someone help you when you read instructions, pamphlets, or other written materials from your doctor or pharmacy?: 1 - Never  Diabetic?  No  Interpreter Needed?: No  Information entered by :: Theresa Mulligan LPN  Activities of Daily Living    12/12/2022   11:08 AM  In your present state of health, do you have any difficulty performing the following activities:  Hearing? 0  Vision? 0  Difficulty concentrating or making decisions? 0  Walking or climbing stairs? 0  Dressing or bathing? 0  Doing errands, shopping? 0  Preparing Food and eating ? N  Using the Toilet? N  In the past six months, have you accidently leaked urine? N  Do you have problems with loss of bowel control? N  Managing your Medications? N  Managing your Finances? N  Housekeeping or managing your Housekeeping? N    Patient Care Team: Deeann Saint, MD as PCP - General (Family Medicine)  Indicate any recent Medical Services you may have received from other than [facility] providers in the past year (date may be approximate).     Assessment:   This is a routine wellness examination for Katherine Kerr.  Hearing/Vision screen Hearing Screening - Comments:: Denies hearing difficulties   Vision Screening - Comments:: Wears rx glasses - up to date with routine eye exams with  Dr Honor Loh  Dietary issues and exercise activities discussed: Exercise limited by: None identified   Goals Addressed               This Visit's Progress     Patient stated (pt-stated)         Depression Screen    12/12/2022   11:07 AM 12/03/2022   10:59 AM 05/01/2022    8:36 AM 11/20/2021    1:31 PM  PHQ 2/9 Scores   PHQ - 2 Score 0 0 0 0  PHQ- 9 Score 0 0      Fall Risk    12/12/2022   11:09 AM 12/03/2022   11:00 AM 05/01/2022    8:36 AM 11/20/2021    1:30 PM  Fall Risk   Falls in the past year? 0 0 0 0  Number falls in past yr: 0 0 0 0  Injury with Fall? 0 0 0 0  Risk for fall due to : No Fall Risks No Fall Risks No Fall Risks No Fall Risks  Follow up Falls prevention discussed Falls evaluation completed Falls evaluation completed Falls evaluation completed;Education provided;Falls prevention discussed    FALL RISK PREVENTION PERTAINING TO THE HOME:  Any stairs in or around the home? Yes  If so, are there any without handrails? No  Home free of loose throw rugs in walkways, pet beds, electrical cords, etc? Yes  Adequate lighting in your home to reduce risk of falls? Yes   ASSISTIVE DEVICES UTILIZED TO PREVENT FALLS:  Life alert? No  Use of a cane, walker or w/c? No  Grab bars in the bathroom? No  Shower chair or bench in shower? No  Elevated toilet seat or a handicapped toilet? No   TIMED UP AND GO:  Was the test performed? No . Audio Visit   Cognitive Function:        12/12/2022   11:10 AM 11/20/2021    1:31 PM  6CIT Screen  What Year? 0 points 0 points  What month? 0 points 0 points  What time? 0 points 0 points  Count back from 20 0 points 0 points  Months in reverse 0 points 0 points  Repeat phrase 0 points 0 points  Total Score 0 points 0 points    Immunizations Immunization History  Administered Date(s) Administered   Influenza Inj Mdck Quad Pf 04/07/2022  Influenza Split 04/17/2018   Influenza,inj,Quad PF,6+ Mos 03/22/2015, 07/04/2016, 02/22/2019   Influenza,inj,quad, With Preservative 03/22/2015, 07/04/2016   Influenza-Unspecified 04/30/2014, 03/22/2015, 06/01/2016, 07/04/2016, 04/17/2018, 04/21/2021   PFIZER Comirnaty(Gray Top)Covid-19 Tri-Sucrose Vaccine 03/24/2022   PFIZER(Purple Top)SARS-COV-2 Vaccination 09/27/2019, 10/18/2019, 05/20/2020, 10/12/2020    Pfizer Covid Bivalent Pediatric Vaccine(62mos to <65yrs) 03/24/2022   Pfizer Covid-19 Vaccine Bivalent Booster 26yrs & up 04/21/2021   Tdap 05/12/2007, 06/19/2016   Zoster Recombinat (Shingrix) 11/19/2016, 01/28/2017   Zoster, Live 09/17/2011, 11/19/2016, 01/28/2017    TDAP status: Up to date  Flu Vaccine status: Up to date    Covid-19 vaccine status: Completed vaccines  Qualifies for Shingles Vaccine? Yes   Zostavax completed Yes   Shingrix Completed?: Yes  Screening Tests Health Maintenance  Topic Date Due   COVID-19 Vaccine (7 - 2023-24 season) 12/19/2022 (Originally 05/19/2022)   Pneumonia Vaccine 75+ Years old (1 of 1 - PCV) 12/03/2023 (Originally 11/22/2020)   INFLUENZA VACCINE  01/31/2023   Medicare Annual Wellness (AWV)  12/12/2023   MAMMOGRAM  06/14/2024   Colonoscopy  08/01/2025   DTaP/Tdap/Td (3 - Td or Tdap) 06/19/2026   DEXA SCAN  Completed   Hepatitis C Screening  Completed   Zoster Vaccines- Shingrix  Completed   HPV VACCINES  Aged Out    Health Maintenance  There are no preventive care reminders to display for this patient.   Colorectal cancer screening: Type of screening: Colonoscopy. Completed 08/02/15. Repeat every 10 years  Mammogram status: Completed 06/14/22. Repeat every year  Bone Density status: Completed 11/01/21. Results reflect: Bone density results: OSTEOPOROSIS. Repeat every   years.  Lung Cancer Screening: (Low Dose CT Chest recommended if Age 68-80 years, 30 pack-year currently smoking OR have quit w/in 15years.) does not qualify.     Additional Screening:  Hepatitis C Screening: does qualify; Completed 06/19/16  Vision Screening: Recommended annual ophthalmology exams for early detection of glaucoma and other disorders of the eye. Is the patient up to date with their annual eye exam?  Yes  Who is the provider or what is the name of the office in which the patient attends annual eye exams? Dr Honor Loh If pt is not established with a  provider, would they like to be referred to a provider to establish care? No .   Dental Screening: Recommended annual dental exams for proper oral hygiene  Community Resource Referral / Chronic Care Management:  CRR required this visit?  No   CCM required this visit?  No      Plan:     I have personally reviewed and noted the following in the patient's chart:   Medical and social history Use of alcohol, tobacco or illicit drugs  Current medications and supplements including opioid prescriptions. Patient is not currently taking opioid prescriptions. Functional ability and status Nutritional status Physical activity Advanced directives List of other physicians Hospitalizations, surgeries, and ER visits in previous 12 months Vitals Screenings to include cognitive, depression, and falls Referrals and appointments  In addition, I have reviewed and discussed with patient certain preventive protocols, quality metrics, and best practice recommendations. A written personalized care plan for preventive services as well as general preventive health recommendations were provided to patient.     [name], LPN   [id number]   Nurse Notes:  None

## 2023-02-12 NOTE — Progress Notes (Signed)
GYNECOLOGY  VISIT   HPI: 67 y.o.   Married  Caucasian  female   G1P1001 with Patient's last menstrual period was 12/31/1999.   here for   6 mo pap repeat.  [facility], negative HR HPV pap on 05/30/22. Colposcopy done 08/07/22: bx of exocervix and the ECC, both showed atrophy.  No dysplasia seen.   Using Vagifem tablets to treat atrophy.   GYNECOLOGIC HISTORY: Patient's last menstrual period was 12/31/1999. Contraception:  supracervical hysterectomy Menopausal hormone therapy:  estradiol Last mammogram:  06/14/22 Breast Density Cat B, BI-RADS CAT 3 probably benign Last pap smear:   05/30/22 ASC-H:HR HPV neg, 09/10/18 neg: HR HPV neg        OB History     Gravida  1   Para  1   Term  1   Preterm      AB      Living  1      SAB      IAB      Ectopic      Multiple      Live Births  1        Obstetric Comments  1 adopted            Patient Active Problem List   Diagnosis Date Noted   Colon cancer screening 05/30/2022   Eustachian tube dysfunction, bilateral 01/10/2017    Past Medical History:  Diagnosis Date   Anemia    history of prior to hysterectomy   Endometriosis    Fibroid    History of kidney stones    Osteopenia 07/07/2013   spine only -1.2   Scabies     Past Surgical History:  Procedure Laterality Date   EYE SURGERY     laser procedure in office   hysteroscopic resection  10/1998   ROBOTIC ASSISTED BILATERAL SALPINGO OOPHERECTOMY Bilateral 04/06/2021   Procedure: XI ROBOTIC ASSISTED BILATERAL SALPINGO OOPHORECTOMY;  Surgeon: Carver Fila, MD;  Location: [facility];  Service: Gynecology;  Laterality: Bilateral;   SUPRACERVICAL ABDOMINAL HYSTERECTOMY  2001   Supracervical laparoscopic hysterectomy, retained ovaries   VARICOSE VEIN SURGERY  2011,2012,2013    Current Outpatient Medications  Medication Sig Dispense Refill   BIOTIN PO Take 5,000 mcg by mouth daily.     Calcium-Vitamin D-Vitamin K (VIACTIV PO) Take 2 tablets by mouth  daily.     Estradiol 10 MCG TABS vaginal tablet Place 1 tablet (10 mcg total) vaginally 2 (two) times a week. 24 tablet 1   fluticasone (FLONASE) 50 MCG/ACT nasal spray 2 sprays daily.     melatonin 5 MG TABS Take 5 mg by mouth at bedtime.     meloxicam (MOBIC) 15 MG tablet Take by mouth.     VITAMIN D PO Take 1,000 Units by mouth daily.     No current facility-administered medications for this visit.     ALLERGIES: Anectine [succinylcholine chloride]  Family History  Problem Relation Age of Onset   Hypertension Mother    Heart attack Mother        pacemaker   Heart disease Father    Stroke Father    Cancer Father        prostate   Cancer Maternal Grandfather        liver   Colon cancer Neg Hx    Breast cancer Neg Hx    Ovarian cancer Neg Hx    Endometrial cancer Neg Hx    Pancreatic cancer Neg Hx     Social History  Socioeconomic History   Marital status: Married    Spouse name: Not on file   Number of children: Not on file   Years of education: Not on file   Highest education level: Bachelor's degree (e.g., BA, AB, BS)  Occupational History   Not on file  Tobacco Use   Smoking status: Never   Smokeless tobacco: Never  Vaping Use   Vaping status: Never Used  Substance and Sexual Activity   Alcohol use: Yes    Alcohol/week: 2.0 - 3.0 standard drinks of alcohol    Types: 2 - 3 Standard drinks or equivalent per week    Comment: occassional/social   Drug use: No   Sexual activity: Not Currently    Partners: Male    Birth control/protection: Surgical    Comment: LAVH, First IC >64 y/o, Partners <5, No hx STIs, DES-neg  Other Topics Concern   Not on file  Social History Narrative   Not on file   Social Determinants of Health   Financial Resource Strain: Low Risk  (12/12/2022)   Overall Financial Resource Strain (CARDIA)    Difficulty of Paying Living Expenses: Not hard at all  Food Insecurity: No Food Insecurity (12/12/2022)   Hunger Vital Sign    Worried  About Running Out of Food in the Last Year: Never true    Ran Out of Food in the Last Year: Never true  Transportation Needs: No Transportation Needs (12/12/2022)   PRAPARE - Administrator, Civil Service (Medical): No    Lack of Transportation (Non-Medical): No  Physical Activity: Sufficiently Active (12/12/2022)   Exercise Vital Sign    Days of Exercise per Week: 5 days    Minutes of Exercise per Session: 60 min  Stress: No Stress Concern Present (12/12/2022)   [employer] of Occupational Health - Occupational Stress Questionnaire    Feeling of Stress : Not at all  Social Connections: Socially Integrated (12/12/2022)   Social Connection and Isolation Panel [NHANES]    Frequency of Communication with Friends and Family: More than three times a week    Frequency of Social Gatherings with Friends and Family: More than three times a week    Attends Religious Services: More than 4 times per year    Active Member of [employer] or Organizations: Yes    Attends Banker Meetings: More than 4 times per year    Marital Status: Married  Catering manager Violence: Not At Risk (12/12/2022)   Humiliation, Afraid, Rape, and Kick questionnaire    Fear of Current or Ex-Partner: No    Emotionally Abused: No    Physically Abused: No    Sexually Abused: No    Review of Systems  All other systems reviewed and are negative.   PHYSICAL EXAMINATION:    BP 116/68 (BP Location: Right Arm, Patient Position: Sitting, Cuff Size: Normal)   Pulse 82   Ht 5' 6.5" (1.689 m)   Wt 151 lb (68.5 kg)   LMP 12/31/1999   SpO2 97%   BMI 24.01 kg/m     General appearance: alert, cooperative and appears stated age  Pelvic: External genitalia:  no lesions              Urethra:  normal appearing urethra with no masses, tenderness or lesions              Bartholins and Skenes: normal  Vagina: normal appearing vagina with normal color and discharge, no lesions.  First degree  cervical prolapse and second degree cystocele              Cervix: no lesions                Bimanual Exam:  Uterus:  absent Pap and HR HPV collected.              Adnexa: no mass, fullness, tenderness           Chaperone was present for exam:  yes.   ASSESSMENT  Supracervical hysterectomy.  Hx abnormal pap of cervix, ASCUS-H pap.  Colposcopy biopsies showing atrophy.  First degree cervical prolapse and second degree cystocele.   PLAN  Pap and HR HPV collected.  Refill of Vagifem. #24, RF 1.  We discussed risks and benefits of local vaginal estrogen treatment.  We discussed pelvic organ prolapse and treatment options:  pelvic floor therapy, pessary and surgical correction.  Anticipate annual exam in 6 months.    33 min  total time was spent for this patient encounter, including preparation, face-to-face counseling with the patient, coordination of care, and documentation of the encounter.

## 2023-02-21 ENCOUNTER — Ambulatory Visit: Payer: Medicare Other | Admitting: Obstetrics and Gynecology

## 2023-02-26 ENCOUNTER — Encounter: Payer: Self-pay | Admitting: Obstetrics and Gynecology

## 2023-02-26 ENCOUNTER — Ambulatory Visit (INDEPENDENT_AMBULATORY_CARE_PROVIDER_SITE_OTHER): Payer: Medicare Other | Admitting: Obstetrics and Gynecology

## 2023-02-26 ENCOUNTER — Other Ambulatory Visit (HOSPITAL_COMMUNITY)
Admission: RE | Admit: 2023-02-26 | Discharge: 2023-02-26 | Disposition: A | Discharge status: Home / Self Care | Payer: Medicare Other | Source: Ambulatory Visit | Attending: Obstetrics and Gynecology | Admitting: Obstetrics and Gynecology

## 2023-02-26 VITALS — BP 116/68 | HR 82 | Ht 66.5 in | Wt 151.0 lb

## 2023-02-26 DIAGNOSIS — Z01419 Encounter for gynecological examination (general) (routine) without abnormal findings: Secondary | ICD-10-CM | POA: Insufficient documentation

## 2023-02-26 DIAGNOSIS — Z124 Encounter for screening for malignant neoplasm of cervix: Secondary | ICD-10-CM

## 2023-02-26 DIAGNOSIS — Z8742 Personal history of other diseases of the female genital tract: Secondary | ICD-10-CM | POA: Diagnosis not present

## 2023-02-26 DIAGNOSIS — N819 Female genital prolapse, unspecified: Secondary | ICD-10-CM

## 2023-02-26 DIAGNOSIS — N952 Postmenopausal atrophic vaginitis: Secondary | ICD-10-CM

## 2023-02-26 DIAGNOSIS — Z5181 Encounter for therapeutic drug level monitoring: Secondary | ICD-10-CM

## 2023-02-26 DIAGNOSIS — Z1151 Encounter for screening for human papillomavirus (HPV): Secondary | ICD-10-CM | POA: Diagnosis not present

## 2023-02-26 MED ORDER — ESTRADIOL 10 MCG VA TABS: 1.0000 | ORAL_TABLET | VAGINAL | 1 refills | Status: DC

## 2023-03-07 LAB — CYTOLOGY - PAP
Comment: NEGATIVE
Diagnosis: NEGATIVE
High risk HPV: NEGATIVE

## 2023-05-16 ENCOUNTER — Other Ambulatory Visit: Payer: Self-pay | Admitting: Family Medicine

## 2023-05-16 DIAGNOSIS — N6310 Unspecified lump in the right breast, unspecified quadrant: Secondary | ICD-10-CM

## 2023-06-17 ENCOUNTER — Ambulatory Visit
Admission: RE | Admit: 2023-06-17 | Discharge: 2023-06-17 | Disposition: A | Discharge status: Home / Self Care | Payer: Medicare Other | Source: Ambulatory Visit | Attending: Family Medicine | Admitting: Family Medicine

## 2023-06-17 DIAGNOSIS — N6310 Unspecified lump in the right breast, unspecified quadrant: Secondary | ICD-10-CM

## 2023-08-12 ENCOUNTER — Telehealth: Payer: Self-pay

## 2023-08-12 ENCOUNTER — Other Ambulatory Visit: Payer: Self-pay | Admitting: Obstetrics and Gynecology

## 2023-08-12 MED ORDER — ESTRADIOL 10 MCG VA TABS: ORAL_TABLET | VAGINAL | 0 refills | Status: DC

## 2023-08-12 NOTE — Telephone Encounter (Signed)
 Patient should use the vaginal estradiol  tablets ideally for about 6 weeks prior to her office visit for her next pap.   She will place the tablet in the vagina nightly for 2 weeks and then place one tablet in the vagina twice weekly at bedtime.   I have sent the prescription to her pharmacy.

## 2023-08-12 NOTE — Telephone Encounter (Signed)
 Patient called stating that she never picked up the estradiol  vaginal tablets that were given to her in August of last year. She wants to know if she should be using that before her appointment. If so she will need a new rx sent in to the pharmacy on file.

## 2023-08-13 NOTE — Telephone Encounter (Signed)
Patient informed of instructions. She voiced understanding

## 2023-08-29 ENCOUNTER — Encounter: Payer: Self-pay | Admitting: Obstetrics and Gynecology

## 2023-08-29 ENCOUNTER — Other Ambulatory Visit (HOSPITAL_COMMUNITY)
Admission: RE | Admit: 2023-08-29 | Discharge: 2023-08-29 | Disposition: A | Discharge status: Home / Self Care | Source: Ambulatory Visit | Attending: Obstetrics and Gynecology | Admitting: Obstetrics and Gynecology

## 2023-08-29 ENCOUNTER — Ambulatory Visit (INDEPENDENT_AMBULATORY_CARE_PROVIDER_SITE_OTHER): Payer: Medicare Other | Admitting: Obstetrics and Gynecology

## 2023-08-29 VITALS — BP 104/62 | HR 58 | Temp 98.1°F | Ht 67.32 in | Wt 152.0 lb

## 2023-08-29 DIAGNOSIS — Z01411 Encounter for gynecological examination (general) (routine) with abnormal findings: Secondary | ICD-10-CM | POA: Insufficient documentation

## 2023-08-29 DIAGNOSIS — E2839 Other primary ovarian failure: Secondary | ICD-10-CM

## 2023-08-29 DIAGNOSIS — Z124 Encounter for screening for malignant neoplasm of cervix: Secondary | ICD-10-CM | POA: Diagnosis not present

## 2023-08-29 DIAGNOSIS — Z01419 Encounter for gynecological examination (general) (routine) without abnormal findings: Secondary | ICD-10-CM | POA: Insufficient documentation

## 2023-08-29 DIAGNOSIS — M8580 Other specified disorders of bone density and structure, unspecified site: Secondary | ICD-10-CM | POA: Insufficient documentation

## 2023-08-29 DIAGNOSIS — Z1151 Encounter for screening for human papillomavirus (HPV): Secondary | ICD-10-CM | POA: Insufficient documentation

## 2023-08-29 DIAGNOSIS — R87611 Atypical squamous cells cannot exclude high grade squamous intraepithelial lesion on cytologic smear of cervix (ASC-H): Secondary | ICD-10-CM

## 2023-08-29 DIAGNOSIS — N952 Postmenopausal atrophic vaginitis: Secondary | ICD-10-CM | POA: Diagnosis not present

## 2023-08-29 DIAGNOSIS — N819 Female genital prolapse, unspecified: Secondary | ICD-10-CM

## 2023-08-29 DIAGNOSIS — Z1331 Encounter for screening for depression: Secondary | ICD-10-CM

## 2023-08-29 DIAGNOSIS — Z8742 Personal history of other diseases of the female genital tract: Secondary | ICD-10-CM | POA: Insufficient documentation

## 2023-08-29 DIAGNOSIS — Z9189 Other specified personal risk factors, not elsewhere classified: Secondary | ICD-10-CM | POA: Diagnosis not present

## 2023-08-29 DIAGNOSIS — Z9289 Personal history of other medical treatment: Secondary | ICD-10-CM

## 2023-08-29 MED ORDER — ESTRADIOL 10 MCG VA TABS: ORAL_TABLET | VAGINAL | 0 refills | Status: AC

## 2023-08-29 NOTE — Assessment & Plan Note (Signed)
 Asymptomatic, recommend kegels.

## 2023-08-29 NOTE — Progress Notes (Signed)
 68 y.o. G1P1001 postmenopausal female s/p supracervical hysterectomy (2001), later robotic lap BSO (2022, benign mass with with Dr. Pricilla Holm) with ASC-H, HPV neg (05/2022), GSM (vagifem), asymptomatic cystocele and rectocele, osteopenia here for annual exam- high risk medicare exam. Married. Retired in 1980s to stay at home.  Doing well, no complaints.  [facility], negative HR HPV pap on 05/30/22. Colposcopy done 08/07/22: bx of exocervix and the ECC, both showed atrophy.  No dysplasia seen.  02/26/23 NIL, HPV neg  Postmenopausal bleeding: none Pelvic discharge or pain: none Breast mass, nipple discharge or skin changes : none Last PAP:     Component Value Date/Time   DIAGPAP  02/26/2023 1441    - Negative for intraepithelial lesion or malignancy (NILM)   DIAGPAP (A) 05/30/2022 1404    - Atypical squamous cells, cannot exclude high grade squamous   DIAGPAP intraepithelial lesion (ASC-H) (A) 05/30/2022 1404   HPVHIGH Negative 02/26/2023 1441   HPVHIGH Negative 05/30/2022 1404   ADEQPAP  02/26/2023 1441    Satisfactory for evaluation; transformation zone component PRESENT.   ADEQPAP Satisfactory for evaluation. 05/30/2022 1404   ADEQPAP  09/10/2018 0000    Satisfactory for evaluation  endocervical/transformation zone component PRESENT.   Last mammogram: 06/17/2023 BI-RADS 2, density C, stable right breast masses. Last colonoscopy: 06/19/2016 f/u 10 years   Last DXA: 11/01/21 osteopenic T-score -2.3, FRAX 7.2/0.4%  Sexually active: no  Exercising: walks Smoker: no  GYN HISTORY: Supracervical hysterectomy Robotic lap BSO (2022, benign mass with with Dr. Pricilla Holm) ASC-H, HPV neg (05/2022) Osteopenia  OB History  Gravida Para Term Preterm AB Living  1 1 1   1   SAB IAB Ectopic Multiple Live Births      1    # Outcome Date GA Lbr Len/2nd Weight Sex Type Anes PTL Lv  1 Term     F Vag-Spont   LIV    Obstetric Comments  1 adopted    Past Medical History:  Diagnosis Date   Anemia     history of prior to hysterectomy   Endometriosis    Fibroid    History of kidney stones    Osteopenia 07/07/2013   spine only -1.2   Scabies     Past Surgical History:  Procedure Laterality Date   EYE SURGERY     laser procedure in office   hysteroscopic resection  10/1998   ROBOTIC ASSISTED BILATERAL SALPINGO OOPHERECTOMY Bilateral 04/06/2021   Procedure: XI ROBOTIC ASSISTED BILATERAL SALPINGO OOPHORECTOMY;  Surgeon: Carver Fila, MD;  Location: [facility];  Service: Gynecology;  Laterality: Bilateral;   SUPRACERVICAL ABDOMINAL HYSTERECTOMY  2001   Supracervical laparoscopic hysterectomy, retained ovaries   VARICOSE VEIN SURGERY  2011,2012,2013    Current Outpatient Medications on File Prior to Visit  Medication Sig Dispense Refill   BIOTIN PO Take 5,000 mcg by mouth daily.     Calcium-Vitamin D-Vitamin K (VIACTIV PO) Take 2 tablets by mouth daily.     fluticasone (FLONASE) 50 MCG/ACT nasal spray 2 sprays daily.     melatonin 5 MG TABS Take 5 mg by mouth at bedtime.     VITAMIN D PO Take 1,000 Units by mouth daily.     No current facility-administered medications on file prior to visit.    Social History   Socioeconomic History   Marital status: Married    Spouse name: Not on file   Number of children: Not on file   Years of education: Not on file   Highest education level:  Bachelor's degree (e.g., BA, AB, BS)  Occupational History   Not on file  Tobacco Use   Smoking status: Never   Smokeless tobacco: Never  Vaping Use   Vaping status: Never Used  Substance and Sexual Activity   Alcohol use: Yes    Alcohol/week: 2.0 - 3.0 standard drinks of alcohol    Types: 2 - 3 Standard drinks or equivalent per week    Comment: occassional/social   Drug use: No   Sexual activity: Not Currently    Partners: Male    Birth control/protection: Surgical    Comment: LAVH, First IC >70 y/o, Partners <5, No hx STIs, DES-neg  Other Topics Concern   Not on file  Social  History Narrative   Not on file   Social Drivers of Health   Financial Resource Strain: Low Risk  (12/12/2022)   Overall Financial Resource Strain (CARDIA)    Difficulty of Paying Living Expenses: Not hard at all  Food Insecurity: No Food Insecurity (12/12/2022)   Hunger Vital Sign    Worried About Running Out of Food in the Last Year: Never true    Ran Out of Food in the Last Year: Never true  Transportation Needs: No Transportation Needs (12/12/2022)   PRAPARE - Administrator, Civil Service (Medical): No    Lack of Transportation (Non-Medical): No  Physical Activity: Sufficiently Active (12/12/2022)   Exercise Vital Sign    Days of Exercise per Week: 5 days    Minutes of Exercise per Session: 60 min  Stress: No Stress Concern Present (12/12/2022)   [employer] of Occupational Health - Occupational Stress Questionnaire    Feeling of Stress : Not at all  Social Connections: Socially Integrated (12/12/2022)   Social Connection and Isolation Panel [NHANES]    Frequency of Communication with Friends and Family: More than three times a week    Frequency of Social Gatherings with Friends and Family: More than three times a week    Attends Religious Services: More than 4 times per year    Active Member of [employer] or Organizations: Yes    Attends Engineer, structural: More than 4 times per year    Marital Status: Married  Catering manager Violence: Not At Risk (12/12/2022)   Humiliation, Afraid, Rape, and Kick questionnaire    Fear of Current or Ex-Partner: No    Emotionally Abused: No    Physically Abused: No    Sexually Abused: No    Family History  Problem Relation Age of Onset   Hypertension Mother    Heart attack Mother        pacemaker   Heart disease Father    Stroke Father    Cancer Father        prostate   Cancer Maternal Grandfather        liver   Colon cancer Neg Hx    Breast cancer Neg Hx    Ovarian cancer Neg Hx    Endometrial cancer Neg Hx     Pancreatic cancer Neg Hx     Allergies  Allergen Reactions   Anectine [Succinylcholine Chloride]     Has trouble coming out of this anesthesia      PE Today's Vitals   08/29/23 1145  BP: 104/62  Pulse: (!) 58  Temp: 98.1 F (36.7 C)  TempSrc: Oral  SpO2: 98%  Weight: 152 lb (68.9 kg)  Height: 5' 7.32" (1.71 m)   Body mass index is 23.58 kg/m.  Physical Exam Vitals reviewed. Exam conducted with a chaperone present.  Constitutional:      General: She is not in acute distress.    Appearance: Normal appearance.  HENT:     Head: Normocephalic and atraumatic.     Nose: Nose normal.  Eyes:     Extraocular Movements: Extraocular movements intact.     Conjunctiva/sclera: Conjunctivae normal.  Neck:     Thyroid: No thyroid mass, thyromegaly or thyroid tenderness.  Pulmonary:     Effort: Pulmonary effort is normal.  Chest:     Chest wall: No mass or tenderness.  Breasts:    Right: Normal. No swelling, mass, nipple discharge or tenderness.     Left: Normal. No swelling, mass, nipple discharge or tenderness.  Abdominal:     General: There is no distension.     Palpations: Abdomen is soft.     Tenderness: There is no abdominal tenderness.  Genitourinary:    General: Normal vulva.     Exam position: Lithotomy position.     Urethra: No prolapse.     Vagina: Normal. No vaginal discharge or bleeding.     Cervix: Normal. No lesion.     Adnexa: Right adnexa normal and left adnexa normal.     Comments: Uterus absent Musculoskeletal:        General: Normal range of motion.     Cervical back: Normal range of motion.  Lymphadenopathy:     Upper Body:     Right upper body: No axillary adenopathy.     Left upper body: No axillary adenopathy.     Lower Body: No right inguinal adenopathy. No left inguinal adenopathy.  Skin:    General: Skin is warm and dry.  Neurological:     General: No focal deficit present.     Mental Status: She is alert.  Psychiatric:        Mood  and Affect: Mood normal.        Behavior: Behavior normal.      Assessment and Plan:        Well woman exam with routine gynecological exam Assessment & Plan: Cervical cancer screening performed according to ASCCP guidelines. Encouraged annual mammogram screening Colonoscopy UTD DXA due in May Labs and immunizations with her primary Encouraged safe sexual practices as indicated Encouraged healthy lifestyle practices with diet and exercise For patients under 50-70yo, I recommend 1200mg  calcium daily and 600IU of vitamin D daily.    History of abnormal cervical Pap smear -     Cytology - PAP  Atypical squamous cells cannot exclude high grade squamous intraepithelial lesion on cytologic smear of cervix (ASC-H) Assessment & Plan: Will need annual PAP x2 years  Orders: -     Cytology - PAP  Cervical cancer screening -     Cytology - PAP  Vaginal atrophy Assessment & Plan: Planning to use before PAPs only  Orders: -     Estradiol; Place one tablet (10 mcg) in the vagina nightly for 2 weeks and then twice weekly at bedtime. Start 1 month before annual exam.  Dispense: 18 tablet; Refill: 0  Female genital prolapse, unspecified type Assessment & Plan: Asymptomatic, recommend kegels.   Osteopenia, unspecified location Assessment & Plan: Continue vitamin D+Calcium Encouraged weight based exercise DXA due May 2025   Orders: -     DG Bone Density; Future  Personal history of other medical treatment  Other primary ovarian failure -     DG Bone Density; Future  Rosalyn Gess, MD

## 2023-08-29 NOTE — Patient Instructions (Signed)

## 2023-08-29 NOTE — Assessment & Plan Note (Signed)
 Continue vitamin D+Calcium Encouraged weight based exercise DXA due May 2025

## 2023-08-29 NOTE — Assessment & Plan Note (Signed)
 Will need annual PAP x2 years

## 2023-08-29 NOTE — Assessment & Plan Note (Signed)
 Cervical cancer screening performed according to ASCCP guidelines. Encouraged annual mammogram screening Colonoscopy UTD DXA due in May Labs and immunizations with her primary Encouraged safe sexual practices as indicated Encouraged healthy lifestyle practices with diet and exercise For patients under 50-68yo, I recommend 1200mg  calcium daily and 600IU of vitamin D daily.

## 2023-08-29 NOTE — Assessment & Plan Note (Signed)
 Planning to use before PAPs only

## 2023-09-03 ENCOUNTER — Encounter: Payer: Self-pay | Admitting: Obstetrics and Gynecology

## 2023-09-03 LAB — CYTOLOGY - PAP
Comment: NEGATIVE
Diagnosis: NEGATIVE
High risk HPV: NEGATIVE

## 2023-12-03 ENCOUNTER — Ambulatory Visit

## 2023-12-03 ENCOUNTER — Telehealth: Payer: Self-pay

## 2023-12-03 NOTE — Telephone Encounter (Signed)
 Reach out to pt regarding to her nurse visit today stating,  pt wants to check on measles immunity and get shot if indicated.  Inform pt we are not able to do that via nurse visit. We would need to follow up with her provider to okay for blood check. Then schedule a lab appt if okay. Pt added she will be traveling oversee and think she proceeded the vaccine.   Pt request a call back as soon as we get a response.  Inform pt, provider is not in today but will be back tomorrow. Pt verbalize understanding.

## 2023-12-04 NOTE — Telephone Encounter (Signed)
 Copied from CRM [id number]. Topic: General - Call Back - No Documentation >> Dec 04, 2023  9:44 AM Katherine Kerr wrote: Reason for CRM: Patient called in to check in if doctor signed off on her getting blood work done. Advised patient from note yesterday that the Provider should be in today and would receive a call on when she can get the labs/vaccines.

## 2023-12-06 ENCOUNTER — Encounter: Payer: Self-pay | Admitting: Family Medicine

## 2023-12-09 ENCOUNTER — Encounter: Payer: Self-pay | Admitting: Family Medicine

## 2023-12-09 ENCOUNTER — Ambulatory Visit (INDEPENDENT_AMBULATORY_CARE_PROVIDER_SITE_OTHER): Admitting: Family Medicine

## 2023-12-09 VITALS — BP 98/68 | HR 77 | Temp 98.8°F | Ht 67.32 in | Wt 150.8 lb

## 2023-12-09 DIAGNOSIS — M8580 Other specified disorders of bone density and structure, unspecified site: Secondary | ICD-10-CM

## 2023-12-09 DIAGNOSIS — Z789 Other specified health status: Secondary | ICD-10-CM | POA: Diagnosis not present

## 2023-12-09 LAB — COMPREHENSIVE METABOLIC PANEL WITH GFR
ALT: 20 U/L (ref 0–35)
AST: 19 U/L (ref 0–37)
Albumin: 4.4 g/dL (ref 3.5–5.2)
Alkaline Phosphatase: 67 U/L (ref 39–117)
BUN: 14 mg/dL (ref 6–23)
CO2: 30 meq/L (ref 19–32)
Calcium: 9.8 mg/dL (ref 8.4–10.5)
Chloride: 104 meq/L (ref 96–112)
Creatinine, Ser: 0.84 mg/dL (ref 0.40–1.20)
GFR: 71.59 mL/min (ref >60.00)
Glucose, Bld: 102 mg/dL — ABNORMAL HIGH (ref 70–99)
Potassium: 4 meq/L (ref 3.5–5.1)
Sodium: 142 meq/L (ref 135–145)
Total Bilirubin: 0.3 mg/dL (ref 0.2–1.2)
Total Protein: 6.6 g/dL (ref 6.0–8.3)

## 2023-12-09 NOTE — Telephone Encounter (Signed)
 Matter discussed at [facility] today.

## 2023-12-09 NOTE — Progress Notes (Signed)
 Established Patient Office Visit   Subjective  Patient ID: Katherine Kerr, female    DOB: 1955-08-06  Age: 68 y.o. MRN: [id number]  No chief complaint on file.   Patient is a 68 year old female seen for follow-up.  Patient last seen by this provider in 2022 however has had follow-up with other providers for acute issues.  Overall patient doing well.  And is without complain.  Has AWV next month.  Requesting MMR titer and possible vaccination if needed for upcoming trip to [place] in September.    Patient Active Problem List   Diagnosis Date Noted   Well woman exam with routine gynecological exam 08/29/2023   Vaginal atrophy 08/29/2023   Atypical squamous cells cannot exclude high grade squamous intraepithelial lesion on cytologic smear of cervix (ASC-H) 08/29/2023   History of abnormal cervical Pap smear 08/29/2023   Female genital prolapse 08/29/2023   Osteopenia 08/29/2023   Colon cancer screening 05/30/2022   Eustachian tube dysfunction, bilateral 01/10/2017   Past Medical History:  Diagnosis Date   Anemia    history of prior to hysterectomy   Endometriosis    Fibroid    History of kidney stones    Osteopenia 07/07/2013   spine only -1.2   Scabies    Past Surgical History:  Procedure Laterality Date   EYE SURGERY     laser procedure in office   hysteroscopic resection  10/1998   ROBOTIC ASSISTED BILATERAL SALPINGO OOPHERECTOMY Bilateral 04/06/2021   Procedure: XI ROBOTIC ASSISTED BILATERAL SALPINGO OOPHORECTOMY;  Surgeon: Suzi Essex, MD;  Location: [facility];  Service: Gynecology;  Laterality: Bilateral;   SUPRACERVICAL ABDOMINAL HYSTERECTOMY  2001   Supracervical laparoscopic hysterectomy, retained ovaries   VARICOSE VEIN SURGERY  2011,2012,2013   Social History   Tobacco Use   Smoking status: Never   Smokeless tobacco: Never  Vaping Use   Vaping status: Never Used  Substance Use Topics   Alcohol use: Yes    Alcohol/week: 2.0 - 3.0 standard drinks  of alcohol    Types: 2 - 3 Standard drinks or equivalent per week    Comment: occassional/social   Drug use: No   Family History  Problem Relation Age of Onset   Hypertension Mother    Heart attack Mother        pacemaker   Heart disease Father    Stroke Father    Cancer Father        prostate   Cancer Maternal Grandfather        liver   Colon cancer Neg Hx    Breast cancer Neg Hx    Ovarian cancer Neg Hx    Endometrial cancer Neg Hx    Pancreatic cancer Neg Hx    Allergies  Allergen Reactions   Anectine [Succinylcholine Chloride]     Has trouble coming out of this anesthesia    ROS Negative unless stated above    Objective:      LMP 12/31/1999  BP Readings from Last 3 Encounters:  08/29/23 104/62  02/26/23 116/68  12/03/22 110/70   Wt Readings from Last 3 Encounters:  08/29/23 152 lb (68.9 kg)  02/26/23 151 lb (68.5 kg)  12/12/22 148 lb (67.1 kg)      Physical Exam Constitutional:      General: She is not in acute distress.    Appearance: Normal appearance.  HENT:     Head: Normocephalic and atraumatic.     Nose: Nose normal.     Mouth/Throat:  Mouth: Mucous membranes are moist.  Cardiovascular:     Rate and Rhythm: Normal rate and regular rhythm.     Heart sounds: Normal heart sounds. No murmur heard.    No gallop.  Pulmonary:     Effort: Pulmonary effort is normal. No respiratory distress.     Breath sounds: Normal breath sounds. No wheezing, rhonchi or rales.  Skin:    General: Skin is warm and dry.  Neurological:     Mental Status: She is alert and oriented to person, place, and time.        12/09/2023    2:34 PM 08/29/2023   11:43 AM 12/12/2022   11:07 AM  Depression screen PHQ 2/9  Decreased Interest 0 0 0  Down, Depressed, Hopeless 0 0 0  PHQ - 2 Score 0 0 0  Altered sleeping 0  0  Tired, decreased energy 0  0  Change in appetite 0  0  Feeling bad or failure about yourself  0  0  Trouble concentrating 0  0  Moving slowly or  fidgety/restless 0  0  Suicidal thoughts 0  0  PHQ-9 Score 0  0  Difficult doing work/chores Not difficult at all  Not difficult at all      12/09/2023    2:34 PM 12/03/2022   11:00 AM  GAD 7 : Generalized Anxiety Score  Nervous, Anxious, on Edge 0 0  Control/stop worrying 0 0  Worry too much - different things 0 0  Trouble relaxing 0 0  Restless 0 0  Easily annoyed or irritable  0  Afraid - awful might happen 0 0  Total GAD 7 Score  0  Anxiety Difficulty Not difficult at all Not difficult at all    No results found for any visits on 12/09/23.    Assessment & Plan:   Measles, mumps, rubella (MMR) vaccination status unknown -     Measles/Mumps/Rubella Immunity -     Comprehensive metabolic panel with GFR  Osteopenia, unspecified location -     Comprehensive metabolic panel with GFR  Will obtain MMR titers.  If needed patient can obtain MMR vaccination at local pharmacy.  CMP ordered as no recent labs in the last several years.  Return if symptoms worsen or fail to improve.   Viola Greulich, MD

## 2023-12-10 LAB — MEASLES/MUMPS/RUBELLA IMMUNITY
Mumps IgG: >300 [AU]/ml
Rubella: >33 {index}
Rubeola IgG: 60 [AU]/ml

## 2023-12-12 ENCOUNTER — Ambulatory Visit: Payer: Self-pay | Admitting: Family Medicine

## 2023-12-16 ENCOUNTER — Ambulatory Visit (INDEPENDENT_AMBULATORY_CARE_PROVIDER_SITE_OTHER): Payer: Medicare Other

## 2023-12-16 VITALS — Ht 67.0 in | Wt 150.0 lb

## 2023-12-16 DIAGNOSIS — Z0000 Encounter for general adult medical examination without abnormal findings: Secondary | ICD-10-CM | POA: Diagnosis not present

## 2023-12-16 NOTE — Patient Instructions (Addendum)
 Katherine Kerr , Thank you for taking time out of your busy schedule to complete your Annual Wellness Visit with me. I enjoyed our conversation and look forward to speaking with you again next year. I, as well as your care team,  appreciate your ongoing commitment to your health goals. Please review the following plan we discussed and let me know if I can assist you in the future. Your Game plan/ To Do List    Referrals: If you haven't heard from the office you've been referred to, please reach out to them at the phone provided.   Follow up Visits: Next Medicare AWV with our clinical staff: 12/21/24 @ 10:40a   Have you seen your provider in the last 6 months (3 months if uncontrolled diabetes)? 12/09/23 Next Office Visit with your provider:   Clinician Recommendations:  Aim for 30 minutes of exercise or brisk walking, 6-8 glasses of water , and 5 servings of fruits and vegetables each day.       This is a list of the screening recommended for you and due dates:  Health Maintenance  Topic Date Due   Pneumococcal Vaccine for age over 54 (1 of 1 - PCV) Never done   COVID-19 Vaccine (9 - 2024-25 season) 12/25/2023*   Flu Shot  01/31/2024   Medicare Annual Wellness Visit  12/15/2024   Mammogram  06/16/2025   Colon Cancer Screening  08/01/2025   DTaP/Tdap/Td vaccine (3 - Td or Tdap) 06/19/2026   DEXA scan (bone density measurement)  Completed   Hepatitis C Screening  Completed   Zoster (Shingles) Vaccine  Completed   HPV Vaccine  Aged Out   Meningitis B Vaccine  Aged Out  *Topic was postponed. The date shown is not the original due date.    Advanced directives: (Copy Requested) Please bring a copy of your health care power of attorney and living will to the office to be added to your chart at your convenience. You can mail to [facility] [address] [place], [place] [address] or email to [email or web address] Advance Care Planning is important because it:  [x]  Makes sure  you receive the medical care that is consistent with your values, goals, and preferences  [x]  It provides guidance to your family and loved ones and reduces their decisional burden about whether or not they are making the right decisions based on your wishes.  Follow the link provided in your after visit summary or read over the paperwork we have mailed to you to help you started getting your Advance Directives in place. If you need assistance in completing these, please reach out to us  so that we can help you!  See attachments for Preventive Care and Fall Prevention Tips.

## 2023-12-16 NOTE — Progress Notes (Signed)
 Subjective:   Katherine Kerr is a 68 y.o. who presents for a Medicare Wellness preventive visit.  As a reminder, Annual Wellness Visits don't include a physical exam, and some assessments may be limited, especially if this visit is performed virtually. We may recommend an in-person follow-up visit with your provider if needed.  Visit Complete: Virtual I connected with  Katherine Kerr on 12/16/23 by a audio enabled telemedicine application and verified that I am speaking with the correct person using two identifiers.  Patient Location: Home  Provider Location: Home Office  I discussed the limitations of evaluation and management by telemedicine. The patient expressed understanding and agreed to proceed.  Vital Signs: Because this visit was a virtual/telehealth visit, some criteria may be missing or patient reported. Any vitals not documented were not able to be obtained and vitals that have been documented are patient reported.    Persons Participating in Visit: Patient.  AWV Questionnaire: No: Patient Medicare AWV questionnaire was not completed prior to this visit.  Cardiac Risk Factors include: advanced age (>37men, >27 women)     Objective:    Today's Vitals   12/16/23 1054  Weight: 150 lb (68 kg)  Height: 5' 7 (1.702 m)   Body mass index is 23.49 kg/m.     12/16/2023   11:01 AM 12/12/2022   11:10 AM 11/20/2021    1:30 PM 04/24/2021   11:30 AM 04/05/2021    3:19 PM 03/30/2021    2:06 PM  Advanced Directives  Does Patient Have a Medical Advance Directive? Yes Yes Yes Yes Yes Yes  Type of Estate agent of [place];Living will Healthcare Power of [place];Living will Healthcare Power of [place];Living will Living will Living will   Does patient want to make changes to medical advance directive?      No - Patient declined  Copy of Healthcare Power of Attorney in Chart? No - copy requested No - copy requested No - copy requested       Current  Medications (verified) Outpatient Encounter Medications as of 12/16/2023  Medication Sig   BIOTIN PO Take 5,000 mcg by mouth daily.   Calcium-Vitamin D -Vitamin K (VIACTIV PO) Take 2 tablets by mouth daily.   Estradiol  10 MCG TABS vaginal tablet Place one tablet (10 mcg) in the vagina nightly for 2 weeks and then twice weekly at bedtime. Start 1 month before annual exam.   fluticasone (FLONASE) 50 MCG/ACT nasal spray 2 sprays daily.   melatonin 5 MG TABS Take 5 mg by mouth at bedtime.   VITAMIN D  PO Take 1,000 Units by mouth daily.   No facility-administered encounter medications on file as of 12/16/2023.    Allergies (verified) Anectine [succinylcholine chloride]   History: Past Medical History:  Diagnosis Date   Anemia    history of prior to hysterectomy   Endometriosis    Fibroid    History of kidney stones    Osteopenia 07/07/2013   spine only -1.2   Scabies    Past Surgical History:  Procedure Laterality Date   EYE SURGERY     laser procedure in office   hysteroscopic resection  10/1998   ROBOTIC ASSISTED BILATERAL SALPINGO OOPHERECTOMY Bilateral 04/06/2021   Procedure: XI ROBOTIC ASSISTED BILATERAL SALPINGO OOPHORECTOMY;  Surgeon: Suzi Essex, MD;  Location: [facility];  Service: Gynecology;  Laterality: Bilateral;   SUPRACERVICAL ABDOMINAL HYSTERECTOMY  2001   Supracervical laparoscopic hysterectomy, retained ovaries   VARICOSE VEIN SURGERY  2011,2012,2013  Family History  Problem Relation Age of Onset   Hypertension Mother    Heart attack Mother        pacemaker   Heart disease Father    Stroke Father    Cancer Father        prostate   Cancer Maternal Grandfather        liver   Colon cancer Neg Hx    Breast cancer Neg Hx    Ovarian cancer Neg Hx    Endometrial cancer Neg Hx    Pancreatic cancer Neg Hx    Social History   Socioeconomic History   Marital status: Married    Spouse name: Not on file   Number of children: Not on file   Years of  education: Not on file   Highest education level: Bachelor's degree (e.g., BA, AB, BS)  Occupational History   Not on file  Tobacco Use   Smoking status: Never   Smokeless tobacco: Never  Vaping Use   Vaping status: Never Used  Substance and Sexual Activity   Alcohol use: Yes    Alcohol/week: 2.0 - 3.0 standard drinks of alcohol    Types: 2 - 3 Standard drinks or equivalent per week    Comment: occassional/social   Drug use: No   Sexual activity: Not Currently    Partners: Male    Birth control/protection: Surgical    Comment: LAVH, First IC >51 y/o, Partners <5, No hx STIs, DES-neg  Other Topics Concern   Not on file  Social History Narrative   Not on file   Social Drivers of Health   Financial Resource Strain: Low Risk  (12/16/2023)   Overall Financial Resource Strain (CARDIA)    Difficulty of Paying Living Expenses: Not hard at all  Food Insecurity: No Food Insecurity (12/16/2023)   Hunger Vital Sign    Worried About Running Out of Food in the Last Year: Never true    Ran Out of Food in the Last Year: Never true  Transportation Needs: No Transportation Needs (12/16/2023)   PRAPARE - Administrator, Civil Service (Medical): No    Lack of Transportation (Non-Medical): No  Physical Activity: Sufficiently Active (12/16/2023)   Exercise Vital Sign    Days of Exercise per Week: 5 days    Minutes of Exercise per Session: 60 min  Stress: No Stress Concern Present (12/16/2023)   [employer] of Occupational Health - Occupational Stress Questionnaire    Feeling of Stress: Not at all  Social Connections: Socially Integrated (12/16/2023)   Social Connection and Isolation Panel    Frequency of Communication with Friends and Family: More than three times a week    Frequency of Social Gatherings with Friends and Family: More than three times a week    Attends Religious Services: More than 4 times per year    Active Member of [employer] or Organizations: Yes    Attends Probation officer: More than 4 times per year    Marital Status: Married    Tobacco Counseling Counseling given: Not Answered    Clinical Intake:  Pre-visit preparation completed: Yes  Pain : No/denies pain     BMI - recorded: 23.49 Nutritional Status: BMI of 19-24  Normal Nutritional Risks: None Diabetes: No  No results found for: HGBA1C   How often do you need to have someone help you when you read instructions, pamphlets, or other written materials from your doctor or pharmacy?: 1 - Never  Interpreter  Needed?: No  Information entered by :: Farris Hong LPN   Activities of Daily Living      12/16/2023   11:00 AM  In your present state of health, do you have any difficulty performing the following activities:  Hearing? 0  Vision? 0  Difficulty concentrating or making decisions? 0  Walking or climbing stairs? 0  Dressing or bathing? 0  Doing errands, shopping? 0  Preparing Food and eating ? N  Using the Toilet? N  In the past six months, have you accidently leaked urine? N  Do you have problems with loss of bowel control? N  Managing your Medications? N  Managing your Finances? N  Housekeeping or managing your Housekeeping? N    Patient Care Team: Viola Greulich, MD as PCP - General (Family Medicine)   I have updated your Care Teams any recent Medical Services you may have received from other providers in the past year.     Assessment:   This is a routine wellness examination for Katherine Kerr.  Hearing/Vision screen Hearing Screening - Comments:: Denies hearing difficulties   Vision Screening - Comments:: Wears rx glasses - up to date with routine eye exams with  Dr Gaynelle Keeling   Goals Addressed               This Visit's Progress     Increase physical activity (pt-stated)        Remain active.       Depression Screen      12/16/2023   10:58 AM 12/09/2023    2:34 PM 08/29/2023   11:43 AM 12/12/2022   11:07 AM 12/03/2022   10:59 AM  05/01/2022    8:36 AM 11/20/2021    1:31 PM  PHQ 2/9 Scores  PHQ - 2 Score 0 0 0 0 0 0 0  PHQ- 9 Score 0 0  0 0      Fall Risk      12/16/2023   11:00 AM 12/09/2023    2:37 PM 08/29/2023   11:42 AM 12/12/2022   11:09 AM 12/03/2022   11:00 AM  Fall Risk   Falls in the past year? 0 0 0 0 0  Number falls in past yr: 0 0 0 0 0  Injury with Fall? 0 0 0 0 0  Risk for fall due to : No Fall Risks No Fall Risks No Fall Risks No Fall Risks No Fall Risks  Follow up Falls evaluation completed Falls evaluation completed Falls evaluation completed Falls prevention discussed Falls evaluation completed    MEDICARE RISK AT HOME:   Medicare Risk at Home Any stairs in or around the home?: Yes If so, are there any without handrails?: No Home free of loose throw rugs in walkways, pet beds, electrical cords, etc?: Yes Adequate lighting in your home to reduce risk of falls?: Yes Life alert?: No Use of a cane, walker or w/c?: No Grab bars in the bathroom?: No Shower chair or bench in shower?: No Elevated toilet seat or a handicapped toilet?: No  TIMED UP AND GO:  Was the test performed?  No  Cognitive Function: 6CIT completed        12/16/2023   11:02 AM 12/12/2022   11:10 AM 11/20/2021    1:31 PM  6CIT Screen  What Year? 0 points 0 points 0 points  What month? 0 points 0 points 0 points  What time? 0 points 0 points 0 points  Count back from 20 0 points 0  points 0 points  Months in reverse 0 points 0 points 0 points  Repeat phrase 0 points 0 points 0 points  Total Score 0 points 0 points 0 points    Immunizations Immunization History  Administered Date(s) Administered   Fluad Trivalent(High Dose 65+) 03/30/2023   Influenza Inj Mdck Quad Pf 04/07/2022   Influenza Split 04/17/2018   Influenza,inj,Quad PF,6+ Mos 03/22/2015, 07/04/2016, 02/22/2019   Influenza,inj,quad, With Preservative 03/22/2015, 07/04/2016   Influenza-Unspecified 04/30/2014, 03/22/2015, 06/01/2016, 07/04/2016,  04/17/2018, 04/21/2021, 04/07/2022   PFIZER Comirnaty(Gray Top)Covid-19 Tri-Sucrose Vaccine 10/12/2020, 03/24/2022   PFIZER(Purple Top)SARS-COV-2 Vaccination 09/27/2019, 10/18/2019, 05/20/2020, 10/12/2020   Pfizer Covid Bivalent Pediatric Vaccine(20mos to <50yrs) 03/24/2022   Pfizer Covid-19 Vaccine Bivalent Booster 23yrs & up 04/21/2021   Pfizer(Comirnaty)Fall Seasonal Vaccine 12 years and older 03/24/2022, 03/30/2023   Respiratory Syncytial Virus Vaccine,Recomb Aduvanted(Arexvy) 06/28/2022   Tdap 05/12/2007, 06/19/2016   Zoster Recombinant(Shingrix) 11/19/2016, 01/28/2017   Zoster, Live 09/17/2011, 11/19/2016, 01/28/2017    Screening Tests Health Maintenance  Topic Date Due   Pneumococcal Vaccine: 50+ Years (1 of 1 - PCV) Never done   COVID-19 Vaccine (9 - 2024-25 season) 12/25/2023 (Originally 09/27/2023)   INFLUENZA VACCINE  01/31/2024   Medicare Annual Wellness (AWV)  12/15/2024   MAMMOGRAM  06/16/2025   Colonoscopy  08/01/2025   DTaP/Tdap/Td (3 - Td or Tdap) 06/19/2026   DEXA SCAN  Completed   Hepatitis C Screening  Completed   Zoster Vaccines- Shingrix  Completed   HPV VACCINES  Aged Out   Meningococcal B Vaccine  Aged Out    Health Maintenance  Health Maintenance Due  Topic Date Due   Pneumococcal Vaccine: 50+ Years (1 of 1 - PCV) Never done   Health Maintenance Items Addressed:   Additional Screening:  Vision Screening: Recommended annual ophthalmology exams for early detection of glaucoma and other disorders of the eye. Would you like a referral to an eye doctor? No    Dental Screening: Recommended annual dental exams for proper oral hygiene  Community Resource Referral / Chronic Care Management: CRR required this visit?  No   CCM required this visit?  No   Plan:    I have personally reviewed and noted the following in the patient's chart:   Medical and social history Use of alcohol, tobacco or illicit drugs  Current medications and supplements  including opioid prescriptions. Patient is not currently taking opioid prescriptions. Functional ability and status Nutritional status Physical activity Advanced directives List of other physicians Hospitalizations, surgeries, and ER visits in previous 12 months Vitals Screenings to include cognitive, depression, and falls Referrals and appointments  In addition, I have reviewed and discussed with patient certain preventive protocols, quality metrics, and best practice recommendations. A written personalized care plan for preventive services as well as general preventive health recommendations were provided to patient.   Dewayne Ford, LPN   [id number]   After Visit Summary: (MyChart) Due to this being a telephonic visit, the after visit summary with patients personalized plan was offered to patient via MyChart   Notes: Nothing significant to report at this time.

## 2024-05-15 ENCOUNTER — Other Ambulatory Visit: Payer: Self-pay | Admitting: Obstetrics and Gynecology

## 2024-05-15 DIAGNOSIS — Z1231 Encounter for screening mammogram for malignant neoplasm of breast: Secondary | ICD-10-CM

## 2024-06-18 ENCOUNTER — Inpatient Hospital Stay: Admission: RE | Admit: 2024-06-18 | Discharge: 2024-06-18 | Attending: Obstetrics and Gynecology

## 2024-06-18 DIAGNOSIS — Z1231 Encounter for screening mammogram for malignant neoplasm of breast: Secondary | ICD-10-CM

## 2024-06-23 ENCOUNTER — Ambulatory Visit: Payer: Self-pay | Admitting: Obstetrics and Gynecology

## 2024-08-31 ENCOUNTER — Encounter: Admitting: Obstetrics and Gynecology

## 2024-12-21 ENCOUNTER — Ambulatory Visit
